# Patient Record
Sex: Female | Born: 1970 | Race: White | Hispanic: No | Marital: Married | Smoking: Never smoker
Health system: Southern US, Community
[De-identification: ages and names within clinical notes are randomized; demographics above are authoritative.]

## PROBLEM LIST (undated history)

## (undated) DIAGNOSIS — F32A Depression, unspecified: Secondary | ICD-10-CM

## (undated) DIAGNOSIS — G43909 Migraine, unspecified, not intractable, without status migrainosus: Secondary | ICD-10-CM

## (undated) DIAGNOSIS — F419 Anxiety disorder, unspecified: Secondary | ICD-10-CM

## (undated) DIAGNOSIS — R51 Headache: Secondary | ICD-10-CM

## (undated) DIAGNOSIS — F329 Major depressive disorder, single episode, unspecified: Secondary | ICD-10-CM

## (undated) HISTORY — DX: Anxiety disorder, unspecified: F41.9

## (undated) HISTORY — DX: Major depressive disorder, single episode, unspecified: F32.9

## (undated) HISTORY — PX: APPENDECTOMY: SHX54

## (undated) HISTORY — DX: Headache: R51

## (undated) HISTORY — DX: Depression, unspecified: F32.A

---

## 1998-03-27 ENCOUNTER — Other Ambulatory Visit: Admission: RE | Admit: 1998-03-27 | Discharge: 1998-03-27 | Payer: Self-pay | Admitting: *Deleted

## 1999-04-10 ENCOUNTER — Other Ambulatory Visit: Admission: RE | Admit: 1999-04-10 | Discharge: 1999-04-10 | Payer: Self-pay | Admitting: *Deleted

## 1999-05-09 ENCOUNTER — Ambulatory Visit (HOSPITAL_COMMUNITY): Admission: RE | Admit: 1999-05-09 | Discharge: 1999-05-09 | Payer: Self-pay | Admitting: Gastroenterology

## 2000-05-14 ENCOUNTER — Other Ambulatory Visit: Admission: RE | Admit: 2000-05-14 | Discharge: 2000-05-14 | Payer: Self-pay | Admitting: *Deleted

## 2001-03-02 ENCOUNTER — Encounter (INDEPENDENT_AMBULATORY_CARE_PROVIDER_SITE_OTHER): Payer: Self-pay | Admitting: Specialist

## 2001-03-02 ENCOUNTER — Ambulatory Visit (HOSPITAL_COMMUNITY): Admission: RE | Admit: 2001-03-02 | Discharge: 2001-03-02 | Payer: Self-pay | Admitting: Obstetrics & Gynecology

## 2001-10-09 ENCOUNTER — Other Ambulatory Visit: Admission: RE | Admit: 2001-10-09 | Discharge: 2001-10-09 | Payer: Self-pay | Admitting: Obstetrics and Gynecology

## 2002-11-10 ENCOUNTER — Other Ambulatory Visit: Admission: RE | Admit: 2002-11-10 | Discharge: 2002-11-10 | Payer: Self-pay | Admitting: Obstetrics and Gynecology

## 2003-11-02 ENCOUNTER — Encounter (INDEPENDENT_AMBULATORY_CARE_PROVIDER_SITE_OTHER): Payer: Self-pay | Admitting: *Deleted

## 2003-11-02 ENCOUNTER — Observation Stay (HOSPITAL_COMMUNITY): Admission: EM | Admit: 2003-11-02 | Discharge: 2003-11-03 | Payer: Self-pay | Admitting: Emergency Medicine

## 2005-09-28 ENCOUNTER — Emergency Department (HOSPITAL_COMMUNITY): Admission: EM | Admit: 2005-09-28 | Discharge: 2005-09-29 | Payer: Self-pay | Admitting: Emergency Medicine

## 2005-09-29 ENCOUNTER — Emergency Department (HOSPITAL_COMMUNITY): Admission: EM | Admit: 2005-09-29 | Discharge: 2005-09-29 | Payer: Self-pay | Admitting: Emergency Medicine

## 2006-02-20 ENCOUNTER — Ambulatory Visit: Payer: Self-pay | Admitting: Family Medicine

## 2006-03-06 ENCOUNTER — Ambulatory Visit: Payer: Self-pay | Admitting: Family Medicine

## 2006-03-20 ENCOUNTER — Ambulatory Visit: Payer: Self-pay | Admitting: Family Medicine

## 2006-11-19 DIAGNOSIS — G43909 Migraine, unspecified, not intractable, without status migrainosus: Secondary | ICD-10-CM | POA: Insufficient documentation

## 2007-03-26 ENCOUNTER — Ambulatory Visit: Payer: Self-pay | Admitting: Family Medicine

## 2007-03-26 DIAGNOSIS — F329 Major depressive disorder, single episode, unspecified: Secondary | ICD-10-CM

## 2007-03-26 DIAGNOSIS — F32A Depression, unspecified: Secondary | ICD-10-CM | POA: Insufficient documentation

## 2007-04-06 ENCOUNTER — Telehealth: Payer: Self-pay | Admitting: Family Medicine

## 2007-08-18 ENCOUNTER — Telehealth: Payer: Self-pay | Admitting: Family Medicine

## 2007-09-18 ENCOUNTER — Encounter: Admission: RE | Admit: 2007-09-18 | Discharge: 2007-09-18 | Payer: Self-pay | Admitting: Surgical Oncology

## 2007-10-26 ENCOUNTER — Emergency Department (HOSPITAL_COMMUNITY): Admission: EM | Admit: 2007-10-26 | Discharge: 2007-10-26 | Payer: Self-pay | Admitting: Emergency Medicine

## 2008-01-11 ENCOUNTER — Ambulatory Visit: Payer: Self-pay | Admitting: Cardiology

## 2008-01-11 ENCOUNTER — Ambulatory Visit: Payer: Self-pay | Admitting: Internal Medicine

## 2008-01-11 ENCOUNTER — Observation Stay (HOSPITAL_COMMUNITY): Admission: EM | Admit: 2008-01-11 | Discharge: 2008-01-12 | Payer: Self-pay | Admitting: Emergency Medicine

## 2008-01-11 DIAGNOSIS — R071 Chest pain on breathing: Secondary | ICD-10-CM | POA: Insufficient documentation

## 2008-01-12 ENCOUNTER — Encounter: Payer: Self-pay | Admitting: Internal Medicine

## 2008-01-12 ENCOUNTER — Ambulatory Visit: Payer: Self-pay | Admitting: Vascular Surgery

## 2008-01-13 ENCOUNTER — Emergency Department (HOSPITAL_COMMUNITY): Admission: EM | Admit: 2008-01-13 | Discharge: 2008-01-13 | Payer: Self-pay | Admitting: Emergency Medicine

## 2008-01-19 ENCOUNTER — Ambulatory Visit: Payer: Self-pay | Admitting: Family Medicine

## 2008-08-30 ENCOUNTER — Telehealth: Payer: Self-pay | Admitting: Internal Medicine

## 2008-09-16 ENCOUNTER — Telehealth: Payer: Self-pay | Admitting: Family Medicine

## 2008-10-03 ENCOUNTER — Telehealth: Payer: Self-pay | Admitting: Family Medicine

## 2008-10-06 ENCOUNTER — Telehealth: Payer: Self-pay | Admitting: *Deleted

## 2008-10-07 ENCOUNTER — Inpatient Hospital Stay (HOSPITAL_COMMUNITY): Admission: EM | Admit: 2008-10-07 | Discharge: 2008-10-09 | Payer: Self-pay | Admitting: Emergency Medicine

## 2008-10-07 ENCOUNTER — Ambulatory Visit: Payer: Self-pay | Admitting: Internal Medicine

## 2008-10-08 ENCOUNTER — Ambulatory Visit: Payer: Self-pay | Admitting: Internal Medicine

## 2008-10-10 ENCOUNTER — Ambulatory Visit: Payer: Self-pay | Admitting: Family Medicine

## 2008-10-10 DIAGNOSIS — N390 Urinary tract infection, site not specified: Secondary | ICD-10-CM | POA: Insufficient documentation

## 2008-10-13 ENCOUNTER — Telehealth: Payer: Self-pay | Admitting: Family Medicine

## 2009-05-03 ENCOUNTER — Ambulatory Visit: Payer: Self-pay | Admitting: Family Medicine

## 2009-05-18 ENCOUNTER — Telehealth: Payer: Self-pay | Admitting: Family Medicine

## 2009-07-28 ENCOUNTER — Telehealth: Payer: Self-pay | Admitting: Family Medicine

## 2010-03-18 ENCOUNTER — Encounter: Payer: Self-pay | Admitting: Orthopedic Surgery

## 2010-03-19 ENCOUNTER — Encounter: Payer: Self-pay | Admitting: Surgical Oncology

## 2010-03-29 NOTE — Progress Notes (Signed)
Summary: REFILL  Phone Note Refill Request Message from:  Fax from Pharmacy  Refills Requested: Medication #1:  RELPAX 40 MG  TABS as needed [facility] FA[facility]----[phone]  Initial call taken by: Warnell Forester,  May 18, 2009 8:56 AM    Prescriptions: RELPAX 40 MG  TABS (ELETRIPTAN HYDROBROMIDE) as needed  #90 x 3   Entered by:   Kern Reap CMA (AAMA)   Authorized by:   Roderick Pee MD   Signed by:   Kern Reap CMA (AAMA) on 05/18/2009   Method used:   Electronically to        [facility]* (mail-order)             ,          Ph: [phone]       Fax: [phone]   RxID:   [id number]

## 2010-03-29 NOTE — Assessment & Plan Note (Signed)
Summary: MED CHECK/REFILLS/CJR   Vital Signs:  Patient profile:   40 year old female Height:      64 inches Weight:      180 pounds BMI:     31.01 Temp:     98.3 degrees F oral BP sitting:   112 / 78  (left arm) Cuff size:   regular  Vitals Entered By: Kern Reap CMA Duncan Dull) (May 03, 2009 10:43 AM)  Reason for Visit follow up meds  History of Present Illness: Charlene Freeman is a 40 year old female, nonsmoker, who comes in today for evaluation of two problems.  She is a history of migraine headaches.  She's currently having about 6 per month.  They always seem to occur about the same time.  In the past.  We tried a beta-blocker, which did not help.  We also tried Topamax, however, she didn't take more than 50 mg a day.  We discussed options.  She elects to go back on the Topamax.  She also takes Motrin and Vicodin for breakthrough migraines.  She went to see Dr. Harrel Carina..... a psychiatrist..... for evaluation of depression because the 40 mg of Celexa did not seem to be helping.  She is currently on Lexapro 30 mg daily however, her insurance will pay for it except for $50 co-pay for 30 tablets.  She would like to discuss options.  I also discussed going back to the headache clinic.  She states that it didn't help  Allergies (verified): No Known Drug Allergies  Past History:  Past medical, surgical, family and social histories (including risk factors) reviewed for relevance to current acute and chronic problems.  Past Medical History: Reviewed history from 03/26/2007 and no changes required. Headache Depression  Past Surgical History: Reviewed history from 11/19/2006 and no changes required. CB x2 Appendectomy  Family History: Reviewed history from 11/19/2006 and no changes required. Family Hsitory Headaches Family History of Glaucoma  Social History: Reviewed history from 11/19/2006 and no changes required. Occupation: Single Never Smoked  Review of Systems      See  HPI  Physical Exam  General:  Well-developed,well-nourished,in no acute distress; alert,appropriate and cooperative throughout examination   Impression & Recommendations:  Problem # 1:  HEADACHE (ICD-784.0) Assessment Deteriorated  The following medications were removed from the medication list:    Corgard 20 Mg Tabs (Nadolol) .Marland Kitchen... 1 tab @ bedtime Her updated medication list for this problem includes:    Relpax 40 Mg Tabs (Eletriptan hydrobromide) .Marland Kitchen... As needed    Hydrocodone-acetaminophen 5-325 Mg Tabs (Hydrocodone-acetaminophen) .Marland Kitchen... As needed ha    [facility] Ibuprofen 200 Mg Caps (Ibuprofen) .Marland Kitchen... As needed ha  Orders: Prescription Created Electronically [phone])  Complete Medication List: 1)  Alprazolam 0.25 Mg Tabs (Alprazolam) .... One up to three times a day as needed 2)  Relpax 40 Mg Tabs (Eletriptan hydrobromide) .... As needed 3)  Hydrocodone-acetaminophen 5-325 Mg Tabs (Hydrocodone-acetaminophen) .... As needed ha 4)  Promethazine Hcl 25 Mg Tabs (Promethazine hcl) .... As needed for ha 5)  Bcp  6)  [facility] Ibuprofen 200 Mg Caps (Ibuprofen) .... As needed ha 7)  Topamax 100 Mg Tabs (Topiramate) .Marland Kitchen.. 1 tab @ bedtime 8)  Celexa 40 Mg Tabs (Citalopram hydrobromide) .Marland Kitchen.. 1 & 1/2 at bedtime  Patient Instructions: 1)  begin Topamax 50 mg at bedtime, in one week increase it to 100 mg at bedtime.  Return in 4 weeks for follow-up. 2)  Take Vicodin and 600 mg of Motrin.  Stat at the onset  of a headache that the Relpax will not resolve. 3)  Stop the Lexapro start Celexa 60 mg daily Prescriptions: HYDROCODONE-ACETAMINOPHEN 5-325 MG  TABS (HYDROCODONE-ACETAMINOPHEN) as needed ha  #50 x 2   Entered and Authorized by:   Roderick Pee MD   Signed by:   Roderick Pee MD on 05/03/2009   Method used:   Print then Give to Patient   RxID:   [id number] PROMETHAZINE HCL 25 MG  TABS (PROMETHAZINE HCL) as needed for ha  #30 x 2   Entered and Authorized by:   Roderick Pee MD   Signed  by:   Roderick Pee MD on 05/03/2009   Method used:   Electronically to        [facility] [phone]* (retail)       [address]       [place], [place]  [address]       Ph: [phone] or [phone]       Fax: [phone]   RxID:   [id number] RELPAX 40 MG  TABS (ELETRIPTAN HYDROBROMIDE) as needed  #20 x 3   Entered and Authorized by:   Roderick Pee MD   Signed by:   Roderick Pee MD on 05/03/2009   Method used:   Electronically to        [facility] [phone]* (retail)       [address]       [place], [place]  [address]       Ph: [phone] or [phone]       Fax: [phone]   RxID:   [id number] CELEXA 40 MG TABS (CITALOPRAM HYDROBROMIDE) 1 & 1/2 at bedtime  #150 x 3   Entered and Authorized by:   Roderick Pee MD   Signed by:   Roderick Pee MD on 05/03/2009   Method used:   Electronically to        [facility] [phone]* (retail)       [address]       [place], [place]  [address]       Ph: [phone] or [phone]       Fax: [phone]   RxID:   [id number] TOPAMAX 100 MG TABS (TOPIRAMATE) 1 tab @ bedtime  #100 x 3   Entered and Authorized by:   Roderick Pee MD   Signed by:   Roderick Pee MD on 05/03/2009   Method used:   Electronically to        [facility] [phone]* (retail)       [address]       [place], [place]  [address]       Ph: [phone] or [phone]       Fax: [phone]   RxID:   [id number]

## 2010-03-29 NOTE — Progress Notes (Signed)
Summary: refill  Phone Note Refill Request Message from:  Fax from Pharmacy on July 28, 2009 1:02 PM  Refills Requested: Medication #1:  CELEXA 40 MG TABS 1 & 1/2 at bedtime. Initial call taken by: Kern Reap CMA (AAMA),  July 28, 2009 1:02 PM    Prescriptions: CELEXA 40 MG TABS (CITALOPRAM HYDROBROMIDE) 1 & 1/2 at bedtime  #150 x 3   Entered by:   Kern Reap CMA (AAMA)   Authorized by:   Roderick Pee MD   Signed by:   Kern Reap CMA (AAMA) on 07/28/2009   Method used:   Faxed to ...       [facility]* (mail-order)             ,          Ph: [phone]       Fax: [phone]   RxID:   [id number]

## 2010-04-22 ENCOUNTER — Encounter: Payer: Self-pay | Admitting: Family Medicine

## 2010-04-23 ENCOUNTER — Encounter: Payer: Self-pay | Admitting: Family Medicine

## 2010-04-23 ENCOUNTER — Ambulatory Visit (INDEPENDENT_AMBULATORY_CARE_PROVIDER_SITE_OTHER): Payer: BC Managed Care – PPO | Admitting: Family Medicine

## 2010-04-23 DIAGNOSIS — N938 Other specified abnormal uterine and vaginal bleeding: Secondary | ICD-10-CM

## 2010-04-23 DIAGNOSIS — N949 Unspecified condition associated with female genital organs and menstrual cycle: Secondary | ICD-10-CM

## 2010-04-23 DIAGNOSIS — R51 Headache: Secondary | ICD-10-CM

## 2010-04-23 MED ORDER — HYDROCODONE-ACETAMINOPHEN 5-325 MG PO TABS: 1.0000 | ORAL_TABLET | ORAL | Status: DC | PRN

## 2010-04-23 MED ORDER — TOPIRAMATE 100 MG PO TABS: ORAL_TABLET | ORAL | Status: DC

## 2010-04-23 MED ORDER — TOPIRAMATE 200 MG PO TABS: 200.0000 mg | ORAL_TABLET | ORAL | Status: DC

## 2010-04-23 MED ORDER — ONDANSETRON HCL 4 MG PO TABS: 4.0000 mg | ORAL_TABLET | Freq: Every day | ORAL | Status: AC | PRN

## 2010-04-23 MED ORDER — LEVONORGEST-ETH ESTRAD 91-DAY 0.15-0.03 MG PO TABS: 1.0000 | ORAL_TABLET | Freq: Every day | ORAL | Status: AC

## 2010-04-23 NOTE — Patient Instructions (Signed)
Increase the Topamax to 200 mg nightly at bedtime.  Zofran and Vicodin p.r.n. For breakthrough migraines.  We start the BCPs one daily at bedtime for 90 days.  Return in 4 weeks for follow-up

## 2010-04-23 NOTE — Progress Notes (Signed)
  Subjective:    Patient ID: Charlene Freeman, female    DOB: 01-Dec-1970, 40 y.o.   MRN: [id number]  HPIPatricia is a 40 year old single female, nonsmoker, who comes in today for evaluation of two problems.  She has a history of migraine headaches.  She is currently taking 150 mg of Topamax daily, however, she does have breakthrough migraines.  She will occasionally have to take more Relpax that her insurance will cover.  She then will take a Vicodin and a Phenergan.  She would like to switch to Zofran.  We discussed increasing her Topamax.  For birth control.  Her partner is using condoms.  We discussed not getting pregnant taking Topamax.  She elects to restart her BCPs Pap by GYN in December 2011 normal  She continues to see the psychiatrist, Dr. Lafayette Freeman for treatment of depression.  She is currently on 40 mg of Lexapro nightly and alprazolam .25t.i.d. P.r.n.Follow-up every 6 months by the psychiatrist  Review of Systems    Negative Objective:   Physical Exam    Well-developed well-nourished, female, no acute distress    Assessment & Plan:  Migraine headaches, not under good control,,,,,,, plan increase the Topamax to 200 mg daily follow-up in one month.  Also change the Phenergan to Zofran p.r.n.  Dub,,,,,,,,,,, restart BCPs

## 2010-04-26 ENCOUNTER — Other Ambulatory Visit: Payer: Self-pay | Admitting: *Deleted

## 2010-04-26 MED ORDER — ELETRIPTAN HYDROBROMIDE 40 MG PO TABS: 40.0000 mg | ORAL_TABLET | ORAL | Status: DC | PRN

## 2010-05-21 ENCOUNTER — Ambulatory Visit: Payer: BC Managed Care – PPO | Admitting: Family Medicine

## 2010-05-21 DIAGNOSIS — Z0289 Encounter for other administrative examinations: Secondary | ICD-10-CM

## 2010-06-02 LAB — CBC
HCT: 31.8 % — ABNORMAL LOW (ref 36.0–46.0)
HCT: 33.7 % — ABNORMAL LOW (ref 36.0–46.0)
Hemoglobin: 10.9 g/dL — ABNORMAL LOW (ref 12.0–15.0)
Hemoglobin: 11.6 g/dL — ABNORMAL LOW (ref 12.0–15.0)
MCHC: 34.3 g/dL (ref 30.0–36.0)
MCHC: 34.3 g/dL (ref 30.0–36.0)
MCV: 90.5 fL (ref 78.0–100.0)
MCV: 90.9 fL (ref 78.0–100.0)
Platelets: 141 10*3/uL — ABNORMAL LOW (ref 150–400)
RBC: 3.49 MIL/uL — ABNORMAL LOW (ref 3.87–5.11)
RBC: 3.73 MIL/uL — ABNORMAL LOW (ref 3.87–5.11)
RDW: 12.4 % (ref 11.5–15.5)
WBC: 9.2 10*3/uL (ref 4.0–10.5)
WBC: 9.5 10*3/uL (ref 4.0–10.5)

## 2010-06-02 LAB — URINALYSIS, ROUTINE W REFLEX MICROSCOPIC
Bilirubin Urine: NEGATIVE
Bilirubin Urine: NEGATIVE
Glucose, UA: NEGATIVE mg/dL
Nitrite: NEGATIVE
Protein, ur: 30 mg/dL — AB
Specific Gravity, Urine: 1.02 (ref 1.005–1.030)
Urobilinogen, UA: 2 mg/dL — ABNORMAL HIGH (ref 0.0–1.0)
pH: 6.5 (ref 5.0–8.0)

## 2010-06-02 LAB — PROTIME-INR: Prothrombin Time: 13.2 seconds (ref 11.6–15.2)

## 2010-06-02 LAB — HEPATIC FUNCTION PANEL
ALT: 15 U/L (ref 0–35)
AST: 19 U/L (ref 0–37)
Alkaline Phosphatase: 55 U/L (ref 39–117)
Bilirubin, Direct: 0.1 mg/dL (ref 0.0–0.3)
Indirect Bilirubin: 0.5 mg/dL (ref 0.3–0.9)
Total Protein: 7 g/dL (ref 6.0–8.3)

## 2010-06-02 LAB — BASIC METABOLIC PANEL
CO2: 23 mEq/L (ref 19–32)
CO2: 26 mEq/L (ref 19–32)
Chloride: 100 mEq/L (ref 96–112)
Chloride: 107 mEq/L (ref 96–112)
GFR calc Af Amer: >60 mL/min (ref >60)
GFR calc Af Amer: >60 mL/min (ref >60)
Potassium: 3.1 mEq/L — ABNORMAL LOW (ref 3.5–5.1)
Potassium: 4.4 mEq/L (ref 3.5–5.1)
Sodium: 136 mEq/L (ref 135–145)

## 2010-06-02 LAB — DIFFERENTIAL
Basophils Absolute: 0 10*3/uL (ref 0.0–0.1)
Basophils Relative: 0 % (ref 0–1)
Lymphocytes Relative: 6 % — ABNORMAL LOW (ref 12–46)
Neutro Abs: 7.8 10*3/uL — ABNORMAL HIGH (ref 1.7–7.7)
Neutrophils Relative %: 85 % — ABNORMAL HIGH (ref 43–77)

## 2010-06-02 LAB — B. BURGDORFI ANTIBODIES BY WB: B burgdorferi IgM Abs (IB): NEGATIVE

## 2010-06-02 LAB — CSF CULTURE W GRAM STAIN: Gram Stain: NONE SEEN

## 2010-06-02 LAB — CSF CELL COUNT WITH DIFFERENTIAL
RBC Count, CSF: 2 /mm3 — ABNORMAL HIGH
Tube #: 4

## 2010-06-02 LAB — URINE MICROSCOPIC-ADD ON

## 2010-06-02 LAB — CULTURE, BLOOD (ROUTINE X 2): Culture: NO GROWTH

## 2010-06-02 LAB — URINE CULTURE
Colony Count: >=100000
Culture: NO GROWTH

## 2010-06-02 LAB — PREGNANCY, URINE: Preg Test, Ur: NEGATIVE

## 2010-06-02 LAB — APTT: aPTT: 29 seconds (ref 24–37)

## 2010-07-10 NOTE — Discharge Summary (Signed)
NAME:  Charlene Freeman, Charlene Freeman             ACCOUNT NO.:  [id number]   MEDICAL RECORD NO.:  [id number]          PATIENT TYPE:  INP   LOCATION:  4715                         FACILITY:  [facility]   PHYSICIAN:  Valerie A. Felicity Coyer, MDDATE OF BIRTH:  [birth year]   DATE OF ADMISSION:  01/11/2008  DATE OF DISCHARGE:  01/12/2008                               DISCHARGE SUMMARY   DIAGNOSES AT TIME OF DISCHARGE:  1. Atypical chest pain, suspect costochondritis.  2. History of migraine headaches.  Positive headache during this      admission.  3. Anxiety.  4. Dyslipidemia.  5. Urinary tract infection, currently on Cipro.  Urine culture pending      at the time of this dictation.   HISTORY OF PRESENT ILLNESS:  Charlene Freeman is a 40 year old female admitted  on January 11, 2008, with chief complaint of chest pain.  She also has  a history of migraines and anxiety.  Apparently at the work the day  prior to admission was feeling at baseline, when all of a sudden she  developed acute shortness of breath and chest pain.  She noted that the  pain radiated to her right arm and also underneath her left breast.  She  was admitted for further evaluation and treatment.   PAST MEDICAL HISTORY:  1. Anxiety.  2. Migraine.   COURSE OF HOSPITALIZATION:  1. Atypical chest pain.  The patient was admitted.  She underwent      serial cardiac enzymes which were negative x3.  EKG noted sinus      tachycardia but was otherwise normal.  A 2-D echo performed during      this admission noted normal LV function with no wall motion      abnormalities.  D-dimer was noted to be mildly elevated which      prompted CT angio.  This unfortunately was suboptimal study,      although it showed no central filling defects.  This was followed      by a V/Q which was negative for PE as well as bilateral lower      extremity Dopplers which were negative for DVT.  Serial cardiac      enzymes were negative.  She was noted to have an elevated    cholesterol with total cholesterol of 224 and an LDL of 156.  She      has been counseled on a low-cholesterol diet but will need follow      up with her primary care Charlene Freeman to determine need for statin      therapy.  Her oxygen saturation was 100% on room air.  We will      initiate NSAIDs at this time and continue the patient on Protonix      at time of discharge.  Defer outpatient need for stress test to the      patient's primary MD.  She denies any first-degree family history      of early coronary artery disease.  2. Anxiety.  Plan to continue p.r.n. benzo and Effexor.  3. Urinary tract infection, currently on Cipro.  Urine culture  pending      at the time of this dictation.  Also of note, the patient was noted      to have some mild elevation of her blood pressure.  BP at the time      of discharge is 145/80.  She may need addition of antihypertensive      medication if her BP remains elevated.  Defer to the patient's      primary MD.  4. History of migraines.  The patient does complain of some nausea as      well as severe headache earlier this morning at the top of the C-      spine near the base of the skull.  It is currently improved but      still present.  At this time, we will give her a dose of Relpax      which she takes at home during acute migraine flares and continue      p.r.n. antiemetics.  The patient tolerated dinner, anticipate      discharge to home later this evening.   MEDICATIONS AT TIME OF DISCHARGE:  1. Celexa 40 mg p.o. daily.  2. Nadolol 20 mg p.o. daily.  3. Xanax 0.5 mg p.o. q.8 h.  4. Motrin 600 mg p.o. q.6 h for the next several days.  5. Cipro 500 mg p.o. b.i.d. x3 days.  6. Percocet 5/325 one tablet p.o. q.4 h as needed.  7. Phenergan 12.5 mg p.o. q.6 h as needed.  8. Protonix 40 mg p.o. daily for reflux.   PERTINENT LABORATORIES:  At time of discharge, D-dimer 0.51, creatinine  0.71, hemoglobin 13.2, total cholesterol 224, HDL 55, and LDL  156.  Serial cardiac enzymes negative.   FOLLOWUP:  She is to follow up with Dr. Kelle Darting on Tuesday,  January 19, 2008, at 11:15 a.m.  She will need follow up of her blood  pressure as well as final urine culture results at this appointment.   Greater than 30 minutes was spent on discharge planning.      Sandford Craze, NP      Raenette Rover. Felicity Coyer, MD  Electronically Signed    MO/MEDQ  D:  01/12/2008  T:  01/13/2008  Job:  [id number]   cc:   Tinnie Gens A. Tawanna Cooler, MD

## 2010-07-10 NOTE — H&P (Signed)
[name], [name]             ACCOUNT NO.:  [id number]   MEDICAL RECORD NO.:  [id number]          PATIENT TYPE:  INP   LOCATION:  4729                         FACILITY:  [facility]   PHYSICIAN:  Michiel Cowboy, MDDATE OF BIRTH:  [birth year]   DATE OF ADMISSION:  01/11/2008  DATE OF DISCHARGE:                              HISTORY & PHYSICAL   PRIMARY CARE Ines Warf:  Dr. Tawanna Cooler.   CHIEF COMPLAINT:  Chest pain.   [name] is a 40 year old female with history of migraines and anxiety  who was at work yesterday, was feeling at baseline although all of the  sudden she developed shortness of breath and chest pain which was well  worse sometimes with lying flat and radiating to her right arm and also  underneath her breast on the left as well as subscapular space on the  left as well.  The pain was severe enough to bring her to tears,  fluctuating in severity but otherwise is constant.  She also endorses  worsening pain with inspiration.  She has not had any long car rides or  plane rides.  She did have a history of taking progesterone recently and  was in the past on Depo-Medrol which she also had stopped because she  was gaining some weight.   REVIEW OF SYSTEMS:  No nausea, no vomiting.  No constipation.  No  diarrhea.  No black stools.  No blood per rectum.  Does not endorse any  presyncope or lightheadedness.  The patient thought that she did have an  anxiety attack and took some Xanax but that did not help.  Otherwise  review of systems unremarkable.   PAST MEDICAL HISTORY:  Significant for anxiety and migraine.   SOCIAL HISTORY:  The patient occasionally smokes and drinks but does not  use IV drugs.   FAMILY HISTORY:  Noncontributory.   ALLERGIES:  No known drug allergies.   MEDICATIONS:  1. Celexa 40 mg daily.  2. Nadolol 20 mg daily.  3. Xanax 0.5 as needed.  4. She takes Relpax if she really needs it.   VITAL SIGNS:  Temperature 98.2, blood pressure 146/90,  respirations 22,  pulse 125, saturating 100% on room air.  The patient appears to be in no  acute distress.  Currently, her pain is under control.  HEAD:  Nontraumatic.  Moist mucous membranes.  LUNGS:  Clear to auscultation bilaterally.  HEART:  Rapid but regular.  No murmurs, rubs or gallops.  LOWER EXTREMITIES:  Without clubbing, cyanosis or edema.  ABDOMEN:  Soft, nontender, nondistended.  NEUROLOGIC:  Intact.   LABORATORY DATA:  White blood cell count 10.5, hemoglobin 13.2,  platelets 231.  D-dimer 0.51.  Sodium 137, potassium 3.3, creatinine  0.71.  Cardiac enzymes within normal limits.  Pregnancy test negative.  UA showing 7-10 white blood cells.  EKG showing sinus tachycardia,  otherwise unremarkable.  CT scan of the chest showing no acute  cardiopulmonary process, suboptimal study main, but there is no central  focal feeling defect seen but unable to evaluate periphery well.   Bedside echogram was performed that showed normal unremarkable  EF with  no evidence of pericardial effusion.   ASSESSMENT AND PLAN:  This is a 40 year old female with severe chest  pain, slightly elevated D-dimer, and CT scan of could not rule out  peripheral pulmonary embolism with tachycardia and shortness of breath.   Chest pain.  Differential would include a small pulmonary embolism  versus acute coronary syndrome which is less likely given her age versus  musculoskeletal versus pericarditis very unlikely given there are no  rubs, no EKG changes, and bedside echocardiogram did not show any  significant evidence of pericardial effusion.   We will cycle cardiac enzymes q.8 h.  In the a.m., will do VQ scan.  For  right now, we will continue Lovenox b.i.d.  Will also obtain Dopplers of  lower extremities.  Will give IV fluids, follow orthostatics, control  pain with Dilaudid and Percocet as needed.  We will obtain fasting lipid  panel, hemoglobin A1c, check TSH.  Will obtain 2-D echocardiogram.    History of anxiety and depression.  Continue home medications.   Prophylaxis.  Will do Protonix.  The patient started on Lovenox.   History of migraines.  Will continue nadolol holding parameters.   Dr. Rene Paci will resume care in a.m.      Michiel Cowboy, MD  Electronically Signed     AVD/MEDQ  D:  01/11/2008  T:  01/12/2008  Job:  [id number]   cc:   Tawanna Cooler, M.D.

## 2010-07-10 NOTE — Discharge Summary (Signed)
NAMEJUNO, Charlene Freeman             ACCOUNT NO.:  [id number]   MEDICAL RECORD NO.:  [id number]          PATIENT TYPE:  INP   LOCATION:  1520                         [facility]ILITY[facility]   PHYSICIAN:  Titus Dubin. Hopper, MD,FACP,FCCPDATE OF BIRTH:  February 19, 1971   DATE OF ADMISSION:  10/07/2008  DATE OF DISCHARGE:  10/09/2008                               DISCHARGE SUMMARY   ADMITTING DIAGNOSES:  1. Fever, rule out bacterial meningitis, possible viral infection,      rule out urinary tract infection.  2. Headache.  3. Anxiety.   DISCHARGE DIAGNOSES:  1. Fever,bacterial meningitis R/Oed  2. Headache.  3. Anxiety.  4. E coli UTI   For details of history, please see the note by Dr. Drue Novel, October 07, 2008.  The patient presented with a chief complaint of fever in the context of  a headache present for 4 to 5 days.  On the morning of October 04, 2008,  she noted a new headache different from her typical migraines in the  occipital area.  Symptoms worsened over the next several days.  Temperature was noted be as high as 101.4, associated with chills.  The  patient described stiffness from the neck down her spine.   CAT scan without contrast was negative.  This was done as a prelude to  LP.  Chest x-ray revealed no active cardiopulmonary disease.   CBC and differential revealed a normal white count; hematocrit was 33.7.  The initial urinalysis showed positive nitrites, moderate leukocytes,  and trace blood.  The patient was having her menses.  It also revealed  21 to 50 white cells and many bacteria.   Her initial potassium was 3.1 and sodium 134.   CSF glucose was 54, total protein 36.  Cell count on the CSF revealed 0  white cells and 2 red cells.   Hepatic function was normal.  Blood cultures were negative at the time  of discharge.   Cath urine on the day after admission was negative,but the urine culture  did show over 100,000 E. coli.   The patient was initially placed on  respiratory isolation.  She received  Rocephin 1 g parenterally daily.   SHE HAS NO KNOWN DRUG ALLERGIES.  Because of the E. coli urinary tract  infection, she will be discharged on Macrodantin.  Initially was planned  to send her home on doxycycline but tick-borne disease is unlikely based  on the history and physical.  [facility] spotted fever titer and  Lyme titers will be collected on the day of discharge. The UTI accounts  for her fever.   On the day of discharge, she was afebrile.  Blood pressure was 101/69.  Respiratory rate was 18.  O2 saturations were 99% on room air.  She  complained of some nausea but her headache had resolved.  Chest was  clear and she had no significant murmurs.  There was minimal tenderness  in the left midabdomen, but as stated she is having her menses.  The  abdomen was soft and no mass was palpable   The platelet count was mildly reduced at 135,000.  Her initial  hypokalemia had been corrected and was 4.4 on the day of discharge.   FINAL DIAGNOSES:  1. Fever.  2. Headache.  3. Urinary tract infection, E. coli   Because of the nausea, she will continue with the Phenergan she has @  home.  She has been asked to follow a clear liquid diet until the nausea  resolves.  Specifically, she was asked to avoid milk, dairy, and grease  and to ingest Jell-O, sherbet, Lipton chicken noodle soup, flat Coke or  flat ginger ale, dry toast, crackers, and baked foods until the nausea  resolves.   DISCHARGE STATUS:  Improved.   PROGNOSIS:  Appears good.   There would be no change in her home meds as listed on the medicine  reconciliation sheet.      Titus Dubin. Alwyn Ren, MD,FACP,FCCP  Electronically Signed     [facility]/MEDQ  D:  10/09/2008  T:  10/09/2008  Job:  [id number]   cc:   Tinnie Gens A. Tawanna Cooler, MD  [address] [place]  [place] [address]   Milagros Evener, M.D.  Fax: [phone]

## 2010-07-13 NOTE — Op Note (Signed)
NAME:  [name], [name]                       ACCOUNT NO.:  [id number]   MEDICAL RECORD NO.:  [id number]                   PATIENT TYPE:  INP   LOCATION:  0447                                 FACILITY:  [facility]   PHYSICIAN:  Thornton Park. Daphine Deutscher, M.D.             DATE OF BIRTH:  07-05-1970   DATE OF PROCEDURE:  11/02/2003  DATE OF DISCHARGE:                                 OPERATIVE REPORT   PREOPERATIVE DIAGNOSIS:  Acute appendicitis.   POSTOPERATIVE DIAGNOSIS:  Early acute appendicitis.   SURGEON:  Thornton Park. Daphine Deutscher, M.D.   ANESTHESIA:  General.   PROCEDURE:  Laparoscopic appendectomy.   DESCRIPTION OF PROCEDURE:  Charlene Freeman is a 40 year old lady having her  birthday on the day of surgery, who presented to the ED with about a 12 hour  history of abdominal pain in the right lower quadrant associated with nausea  and vomiting.  She has had similar pains at different times in the past, one  of which led her to the gynecologist for complete gynecologic work-up which  was negative.  These pains would abate.  However, the pain today was very  similar, came on, and stayed.   She had a CT scan and an ultrasound which showed the appendix to be upper  limits of normal in thickness, although there was no free fluid around it.  We discussed management, and I examined her and despite her being medicated  with Dilaudid, she still had point tenderness at McBurney's to palpation.  For that reason, we decided after discussions of various options to go ahead  and remove her appendix laparoscopically.  She is aware that we might take  out a negative appendix and aware of the complications of the procedure.   She was taken to room 1 in the early morning hours of September 7 and  general anesthesia.  She received 1 g of Mefoxin preop.  Longitudinal  incision was made down into the umbilicus, and there I found a tiny little  umbilical hernia which I enlarged to accommodate the Hasson trocar.   The  abdomen was insufflated, and a 5 mm was placed in the right upper quadrant,  and I explored the abdomen.  I took pictures which were on the chart which  showed fairly sizable right and left ovaries but for her age, within normal  limits, normal gallbladder and this turgid appendix, particularly at its  tip.   The procedure then progressed to put a 10-11 in the left lower quadrant  through which the scope went.  I operated through the other port sites and  grasped the appendix and elevated it.  I used the harmonic scalpel to  dissect the mesentery and divide the mesentery, isolating the base.  I then  stapled across the base with a vascular stapler of EndoGIA type, and I  placed the appendix in a bag and brought out through the umbilicus.  I then  went back in and irrigated with saline and saw no bleeding.  Everything  appeared to be in order.  I ran the distal bowel with Glassman pick-ups, and  I ran the terminal ileum for about the distal third, and this all appeared  to be normal.  The umbilical  port was repaired with 2-0 Vicryls under direct vision, and then the ports  were withdrawn, and the abdomen was deflated.  The patient seemed to  tolerate the procedure well, and she was taken to the recovery room in  satisfactory condition.                                               Thornton Park Daphine Deutscher, M.D.    MBM/MEDQ  D:  11/02/2003  T:  11/02/2003  Job:  [id number]

## 2010-07-13 NOTE — Op Note (Signed)
[facility]  Patient:    Charlene Freeman, Charlene Freeman Visit Number: [id number] MRN: [id number]          Service Type: DSU Location: [facility] Attending Physician:  Genia Del Dictated by:   Genia Del, M.D. Proc. Date: 03/02/01 Admit Date:  03/02/2001                             Operative Report  PREOPERATIVE DIAGNOSIS:       Incomplete abortion, post _____ , first trimester.  POSTOPERATIVE DIAGNOSIS:      Incomplete abortion, post _____ , first trimester.  OPERATION:                    Intervention dilatation and curettage.  SURGEON:                      Genia Del, M.D.  ANESTHESIOLOGIST:          Ellison Hughs., M.D.  DESCRIPTION OF PROCEDURE:     Under MAC analgesia, the patient is in the lithotomy position.  She was prepped with Betadine at the suprapubic, vulva and vaginal areas and draped as usual.  The vaginal exam reveals a retroverted uterus corresponding to seven to eight weeks gestation.  Cervix is closed.  No adnexal mass.  The speculum was introduced.  The anterior lip of the cervix was grasped with a tenaculum.  The paracervical block is done with lidocaine 1% plain, 10 cc at the 4 oclock and 10 cc at 8 oclock.  We then proceed with dilatation with Hegar dilators up to #29 easily.  A #8 curved suction curet is then used and brings back products of conception in small amounts.  This is sent to pathology.  We then used a sharp curet to systemically curettage all uterine surfaces.  The uterine sound is heard all over.  We the used the suction curet once more.  No products of conception were brought back at that point.  The instruments are removed.  The estimated blood loss was minimal. The patient was brought to the recovery room in good status.  She is RH negative and received the RhoGAM vaccine at [facility] office. Dictated by:   Genia Del, M.D. Attending Physician:  Genia Del DD:   03/02/01 TD:  03/02/01 Job: [id number]

## 2010-07-13 NOTE — Op Note (Signed)
[facility]  Patient:    Charlene Freeman, Charlene Freeman                    MRN: [id number] Proc. Date: 05/09/99 Adm. Date:  [id number] Attending:  Charna Elizabeth CC:         Sung Amabile. Roslyn Smiling, M.D.                           Operative Report  PROCEDURE:  Flexible sigmoidoscopy.  ENDOSCOPIST:  Anselmo Rod, M.D.  INSTRUMENT USED:  Olympus video colonoscope.  INDICATIONS:  This is a 40 year old white female with rectal bleeding and severe constipation, rule out internal hemorrhoids, colitis, etc.  PREPROCEDURE PREPARATION:  Informed consent was procured from the patient and the patient was fasted for eight hours prior to the procedure and prepped with two Fleets enemas the morning of the procedure.  PREPROCEDURE PHYSICAL EXAMINATION:  The patient had stable vital signs.  NECK: Supple.  CHEST: Clear to auscultation.  S1 and S2 regular.  ABDOMEN: Soft with normal abdominal bowel sounds.  DESCRIPTION OF PROCEDURE:  The patient was placed in the left lateral decubitus  position and sedated with 70 mg of Demerol and 6 mg of Versed intravenously. Once the patient was adequately sedated and maintained on low flow oxygen and continuous cardiac monitoring, the Olympus video colonoscope was advanced from the rectum o 80 cm without difficulty.  Except for internal hemorrhoids, no other abnormalities were seen.  The entire colonic mucosa appeared healthy with a normal vascular pattern.  IMPRESSION:  Normal flexible sigmoidoscopy up to 80 cm except for internal hemorrhoid.  RECOMMENDATIONS:  The patient has been advised to increase the fluid and fiber n her diet and to follow up in the office in the next two weeks. DD:  05/09/99 TD:  05/09/99 Job: [id number]

## 2010-11-27 LAB — DIFFERENTIAL
Basophils Absolute: 0
Basophils Relative: 0
Basophils Relative: 2 — ABNORMAL HIGH
Eosinophils Absolute: 0.1
Lymphocytes Relative: 35
Lymphs Abs: 2.9
Monocytes Absolute: 0.6
Monocytes Relative: 7
Neutro Abs: 4.5
Neutro Abs: 7.8 — ABNORMAL HIGH
Neutrophils Relative %: 75

## 2010-11-27 LAB — WET PREP, GENITAL
Trich, Wet Prep: NONE SEEN
Yeast Wet Prep HPF POC: NONE SEEN

## 2010-11-27 LAB — GC/CHLAMYDIA PROBE AMP, GENITAL
Chlamydia, DNA Probe: NEGATIVE
GC Probe Amp, Genital: NEGATIVE

## 2010-11-27 LAB — PROTEIN S ACTIVITY: Protein S Activity: 105 % (ref 69–129)

## 2010-11-27 LAB — URINALYSIS, ROUTINE W REFLEX MICROSCOPIC
Bilirubin Urine: NEGATIVE
Bilirubin Urine: NEGATIVE
Glucose, UA: NEGATIVE
Hgb urine dipstick: NEGATIVE
Ketones, ur: NEGATIVE
Ketones, ur: NEGATIVE
Nitrite: NEGATIVE
Protein, ur: NEGATIVE
Urobilinogen, UA: 1
Urobilinogen, UA: 1

## 2010-11-27 LAB — COMPREHENSIVE METABOLIC PANEL
BUN: 7
CO2: 23
Calcium: 9.5
Chloride: 104
Creatinine, Ser: 0.71
GFR calc non Af Amer: >60
Total Bilirubin: 0.6

## 2010-11-27 LAB — LUPUS ANTICOAGULANT PANEL
PTTLA 4:1 Mix: 50.2 — ABNORMAL HIGH (ref 36.3–48.8)
PTTLA Confirmation: 0 (ref <8.0)

## 2010-11-27 LAB — CBC
HCT: 38.8
Hemoglobin: 12.3
Hemoglobin: 12.4
MCHC: 33.6
MCHC: 34.1
MCV: 91.9
Platelets: 183
Platelets: 231
RBC: 3.96
RBC: 4.22
RDW: 12.4
WBC: 10.5
WBC: 6.5

## 2010-11-27 LAB — LIPID PANEL
HDL: 55
LDL Cholesterol: 156 — ABNORMAL HIGH
Triglycerides: 63
VLDL: 13

## 2010-11-27 LAB — ANTITHROMBIN III: AntiThromb III Func: 106 (ref 76–126)

## 2010-11-27 LAB — ANA: Anti Nuclear Antibody(ANA): NEGATIVE

## 2010-11-27 LAB — BASIC METABOLIC PANEL
CO2: 25
Calcium: 8.9
Chloride: 106
GFR calc Af Amer: >60
Sodium: 136

## 2010-11-27 LAB — TROPONIN I: Troponin I: 0.02

## 2010-11-27 LAB — POCT CARDIAC MARKERS
CKMB, poc: <1 — ABNORMAL LOW
Myoglobin, poc: 58.6

## 2010-11-27 LAB — APTT
aPTT: 25
aPTT: 31

## 2010-11-27 LAB — BETA-2-GLYCOPROTEIN I ABS, IGG/M/A
Beta-2-Glycoprotein I IgA: <4 U/mL (ref <10)
Beta-2-Glycoprotein I IgM: <4 U/mL (ref <10)

## 2010-11-27 LAB — HEMOGLOBIN A1C: Mean Plasma Glucose: 105

## 2010-11-27 LAB — URINE CULTURE: Colony Count: >=100000

## 2010-11-27 LAB — TSH: TSH: 4.458

## 2010-11-27 LAB — CK TOTAL AND CKMB (NOT AT ARMC): CK, MB: 1.1

## 2010-11-27 LAB — URINE MICROSCOPIC-ADD ON

## 2010-11-27 LAB — HOMOCYSTEINE: Homocysteine: 6.8

## 2010-11-27 LAB — D-DIMER, QUANTITATIVE: D-Dimer, Quant: 0.51 — ABNORMAL HIGH

## 2010-11-27 LAB — PROTEIN C ACTIVITY: Protein C Activity: 163 % — ABNORMAL HIGH (ref 75–133)

## 2010-11-27 LAB — PREGNANCY, URINE: Preg Test, Ur: NEGATIVE

## 2010-11-27 LAB — PROTHROMBIN GENE MUTATION

## 2011-02-20 ENCOUNTER — Telehealth: Payer: Self-pay | Admitting: Family Medicine

## 2011-02-20 NOTE — Telephone Encounter (Signed)
Asked her to call her insurance company to find out what they cover

## 2011-02-20 NOTE — Telephone Encounter (Signed)
Pt's prior auth for Relpax was denied. Please advise.

## 2011-02-21 NOTE — Telephone Encounter (Signed)
Please call...friday  afternoon for evaluation of migraine headaches

## 2011-02-21 NOTE — Telephone Encounter (Signed)
Patient picked up a prescription as a cash pay patient.  She will need a refill for Vicodin for her migraine is this okay to fill?

## 2011-02-21 NOTE — Telephone Encounter (Signed)
Pt aware of appt 02/22/11 at 3:45 with Dr. Tawanna Cooler.

## 2011-02-22 ENCOUNTER — Ambulatory Visit: Payer: BC Managed Care – PPO | Admitting: Family Medicine

## 2011-02-22 DIAGNOSIS — Z0289 Encounter for other administrative examinations: Secondary | ICD-10-CM

## 2011-03-28 ENCOUNTER — Other Ambulatory Visit: Payer: Self-pay | Admitting: Family Medicine

## 2011-03-28 NOTE — Telephone Encounter (Signed)
Pt last seen 02/21/11.  Rx last filled 04/26/2010 #10x6.  Pls advise.

## 2011-07-16 ENCOUNTER — Ambulatory Visit: Payer: BC Managed Care – PPO | Admitting: Family Medicine

## 2012-05-07 ENCOUNTER — Ambulatory Visit
Admission: RE | Admit: 2012-05-07 | Discharge: 2012-05-07 | Disposition: A | Discharge status: Home / Self Care | Payer: BC Managed Care – PPO | Source: Ambulatory Visit | Attending: Family Medicine | Admitting: Family Medicine

## 2012-05-07 ENCOUNTER — Other Ambulatory Visit: Payer: Self-pay | Admitting: Family Medicine

## 2012-05-07 DIAGNOSIS — R1031 Right lower quadrant pain: Secondary | ICD-10-CM

## 2012-05-07 MED ORDER — IOHEXOL 300 MG/ML  SOLN
40.0000 mL | Freq: Once | INTRAMUSCULAR | Status: AC | PRN
  Administered 2012-05-07: 40 mL via ORAL

## 2012-05-07 MED ORDER — IOHEXOL 300 MG/ML  SOLN
100.0000 mL | Freq: Once | INTRAMUSCULAR | Status: AC | PRN
  Administered 2012-05-07: 100 mL via INTRAVENOUS

## 2013-09-15 IMAGING — CT CT ABD-PELV W/ CM
2 of 4 series · 17 of 46 positions shown, 19 images · IV contrast (30CC OMNI 300 & [ID] OMNI 300)
Comparison: Ultrasound the pelvis of 05/06/2012

CLINICAL DATA: Right lower quadrant pain, nausea, pelvic tenderness

CT ABDOMEN AND PELVIS WITH CONTRAST
TECHNIQUE: Multidetector CT imaging of the abdomen and pelvis was
performed following the standard protocol during bolus
administration of intravenous contrast.
Contrast: 40mL OMNIPAQUE IOHEXOL 300 MG/ML  SOLN, 100mL OMNIPAQUE
IOHEXOL 300 MG/ML  SOLN

[Series 2: abd/pelvis with · axial · 0.70mm/px · z∈[-459,-39]mm · 14 of 87 slices shown, 16 images]
[im 4/87  soft-tissue]
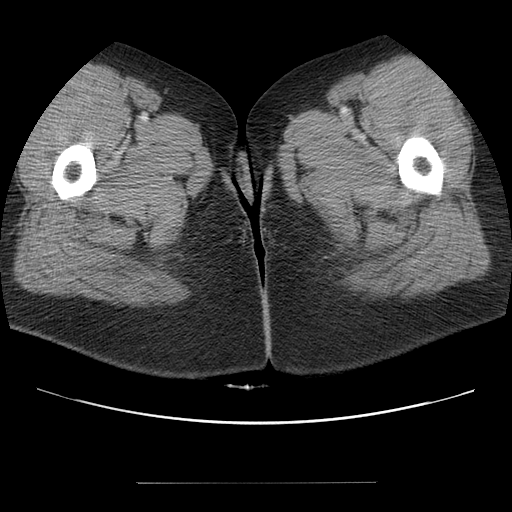
[im 4/87  bone]
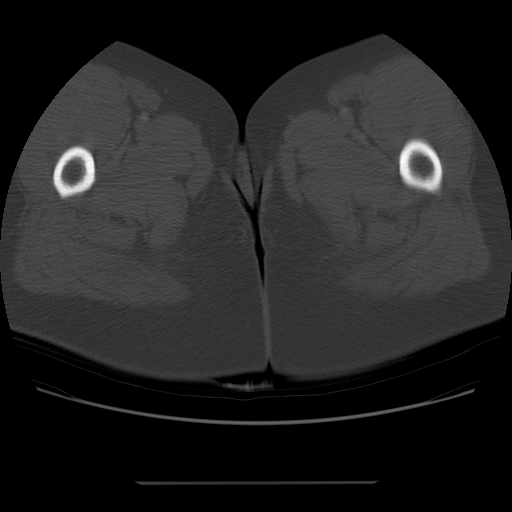
[im 11/87  soft-tissue]
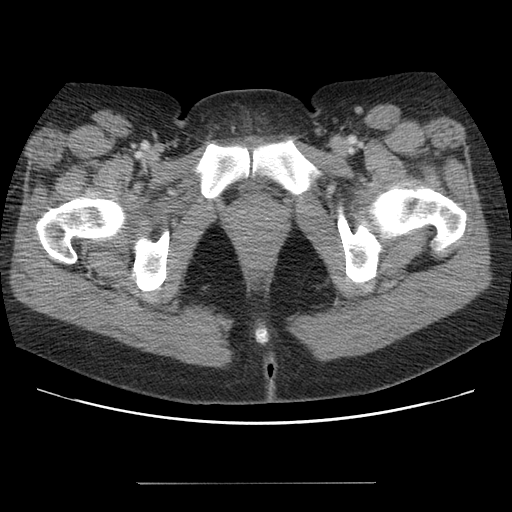
[im 18/87  soft-tissue]
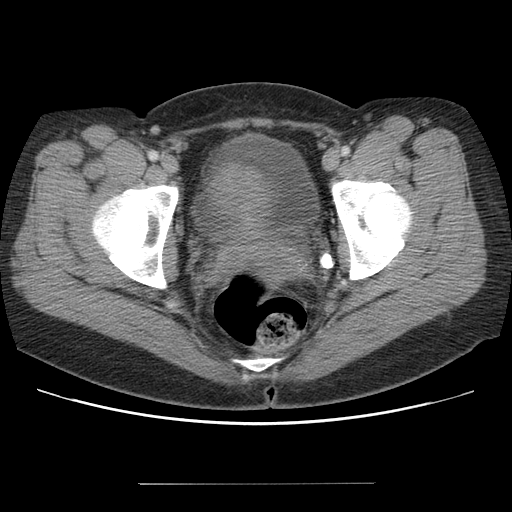
[im 25/87  soft-tissue]
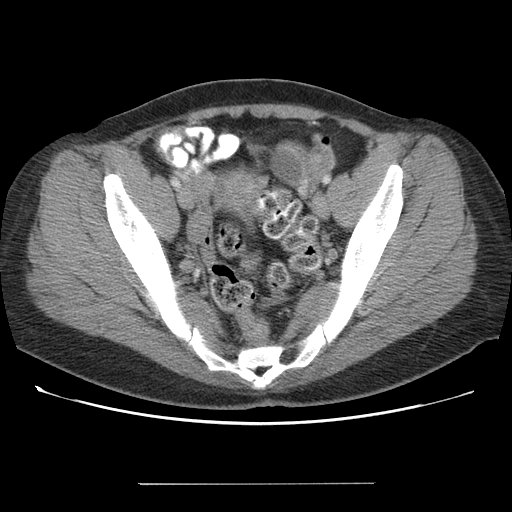
[im 28/87  soft-tissue]
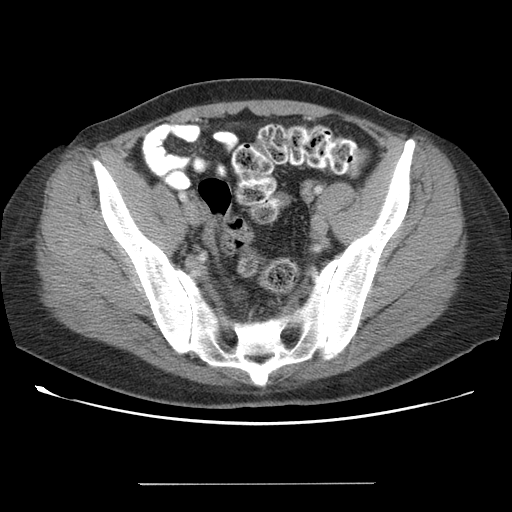
[im 35/87  soft-tissue]
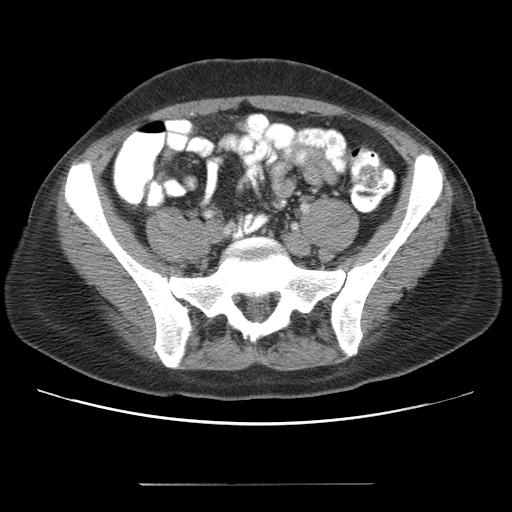
[im 42/87  soft-tissue]
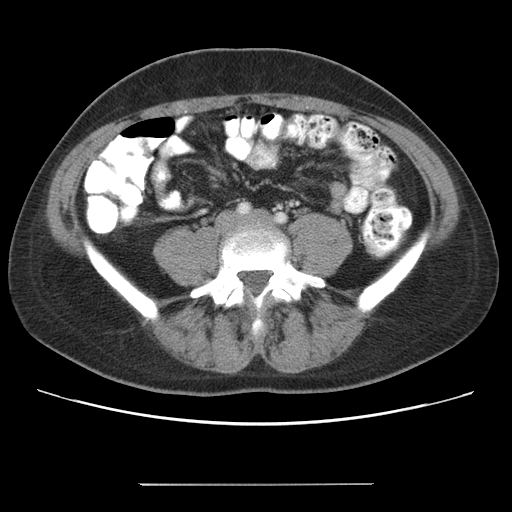
[im 45/87  soft-tissue]
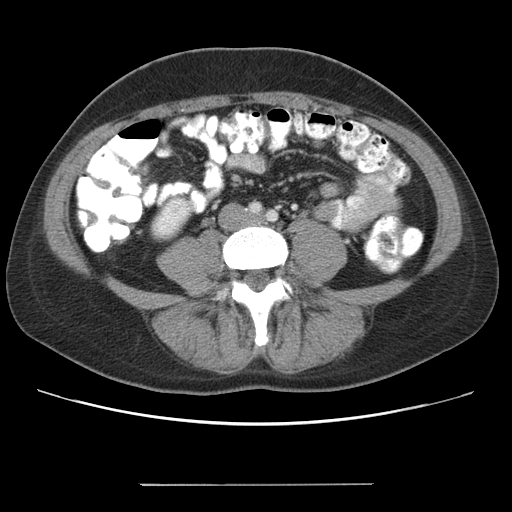
[im 52/87  soft-tissue]
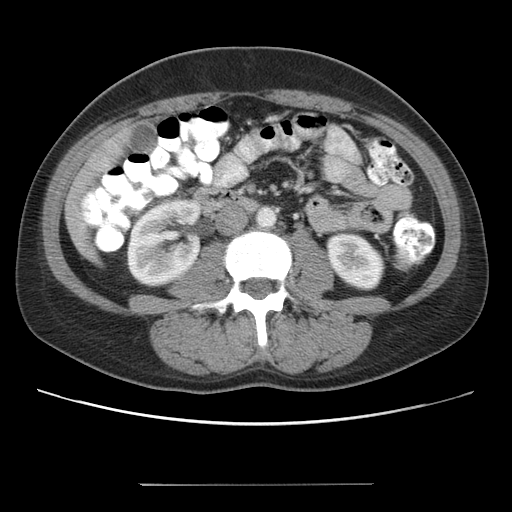
[im 52/87  bone]
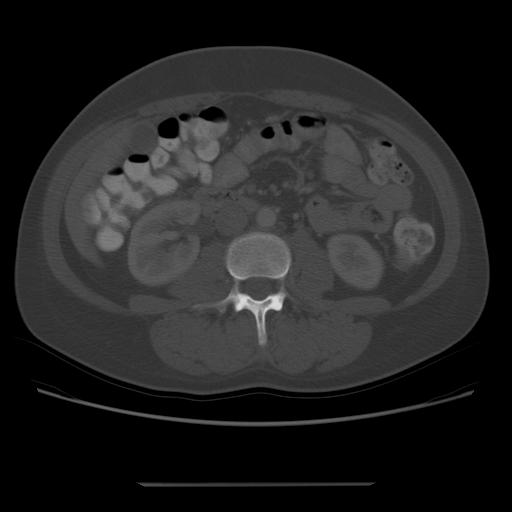
[im 59/87  soft-tissue]
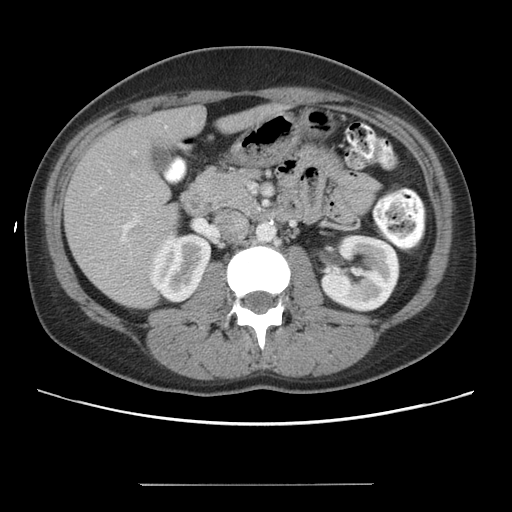
[im 66/87  soft-tissue]
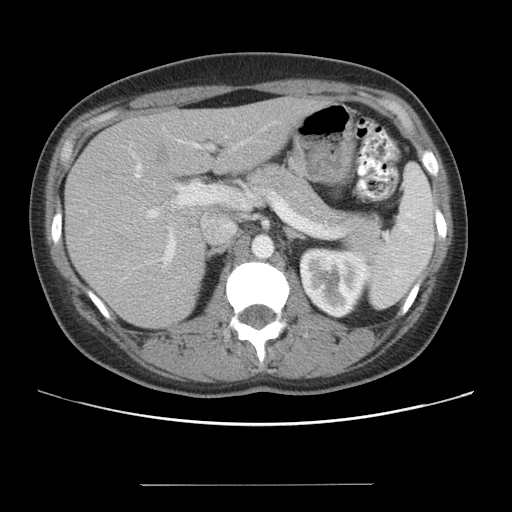
[im 69/87  soft-tissue]
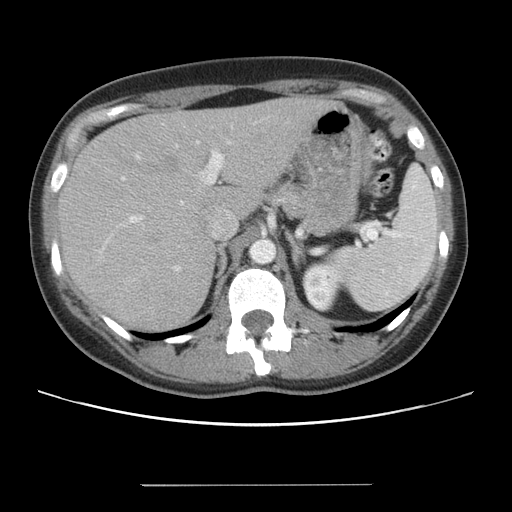
[im 76/87  soft-tissue]
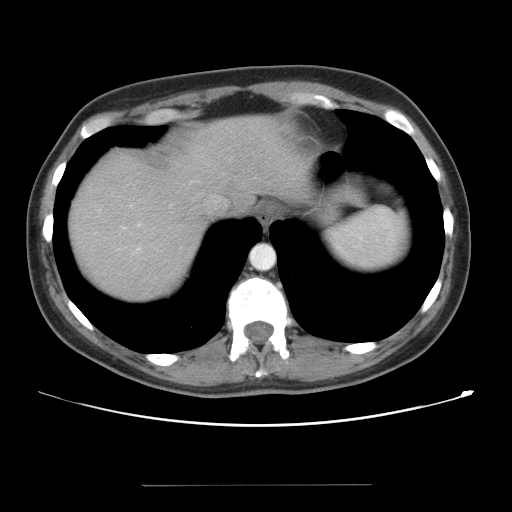
[im 83/87  soft-tissue]
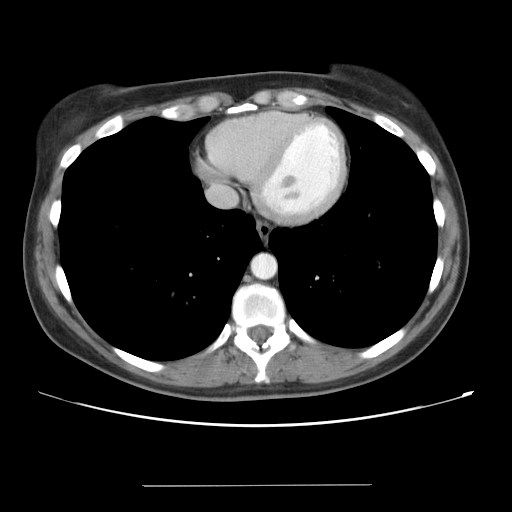

[Series 400: cor · coronal · 0.93mm/px · 3 of 144 slices shown]
[im 48/144  soft-tissue]
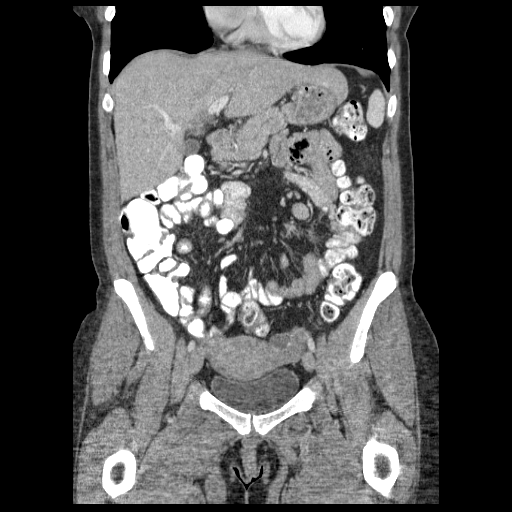
[im 64/144  soft-tissue]
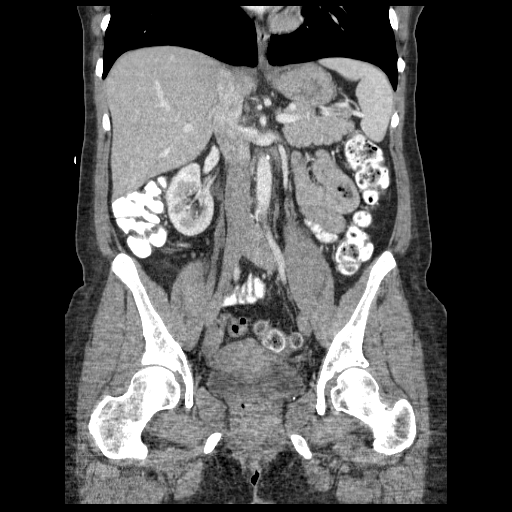
[im 80/144  soft-tissue]
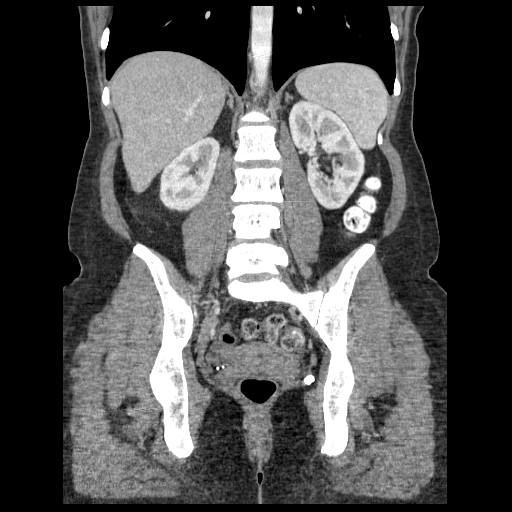

[17 of 46 positions shown; findings below may reference images not displayed]

FINDINGS: The lung bases are clear.  The liver enhances with no
focal abnormality and no ductal dilatation is seen.  No calcified
gallstones are noted.  The pancreas is normal in size and the
pancreatic duct is not dilated.  The adrenal glands and spleen are
unremarkable.  The stomach is not well distended.  The kidneys
enhance with no calculus or mass and no hydronephrosis is seen.
The abdominal aorta is normal in caliber.  No adenopathy is noted.

The urinary bladder is decompressed and cannot be well evaluated.
The uterus is anteverted. There does appear to be a collapsing left
ovarian cyst anteriorly, and there is a small amount of free fluid
in the pelvis.  Small right ovarian follicles are noted.  No
abnormality of the colon is seen.  The terminal ileum is
unremarkable.  The appendix appears somewhat shortened, but it does
fill with oral contrast and no CT evidence of appendicitis is
currently seen.  No bony abnormality is seen.
IMPRESSION: 1.  Apparent collapsing left ovarian cyst with a small amount of
free fluid in the pelvis.
2.  The appendix appears short but there is no CT evidence of acute
appendicitis.

## 2014-04-21 ENCOUNTER — Emergency Department (HOSPITAL_COMMUNITY)
Admission: EM | Admit: 2014-04-21 | Discharge: 2014-04-22 | Disposition: A | Discharge status: Home / Self Care | Payer: BLUE CROSS/BLUE SHIELD | Attending: Emergency Medicine | Admitting: Emergency Medicine

## 2014-04-21 ENCOUNTER — Encounter (HOSPITAL_COMMUNITY): Payer: Self-pay | Admitting: Family Medicine

## 2014-04-21 DIAGNOSIS — B349 Viral infection, unspecified: Secondary | ICD-10-CM

## 2014-04-21 DIAGNOSIS — R51 Headache: Secondary | ICD-10-CM | POA: Diagnosis not present

## 2014-04-21 DIAGNOSIS — M542 Cervicalgia: Secondary | ICD-10-CM | POA: Insufficient documentation

## 2014-04-21 DIAGNOSIS — R110 Nausea: Secondary | ICD-10-CM | POA: Diagnosis not present

## 2014-04-21 DIAGNOSIS — Z8679 Personal history of other diseases of the circulatory system: Secondary | ICD-10-CM | POA: Diagnosis not present

## 2014-04-21 DIAGNOSIS — M436 Torticollis: Secondary | ICD-10-CM

## 2014-04-21 DIAGNOSIS — M549 Dorsalgia, unspecified: Secondary | ICD-10-CM | POA: Diagnosis not present

## 2014-04-21 DIAGNOSIS — R519 Headache, unspecified: Secondary | ICD-10-CM

## 2014-04-21 HISTORY — DX: Migraine, unspecified, not intractable, without status migrainosus: G43.909

## 2014-04-21 LAB — COMPREHENSIVE METABOLIC PANEL
ALK PHOS: 64 U/L (ref 39–117)
ALT: 22 U/L (ref 0–35)
AST: 21 U/L (ref 0–37)
Albumin: 3.9 g/dL (ref 3.5–5.2)
Anion gap: 8 (ref 5–15)
BILIRUBIN TOTAL: 0.4 mg/dL (ref 0.3–1.2)
BUN: 11 mg/dL (ref 6–23)
CHLORIDE: 112 mmol/L (ref 96–112)
CO2: 18 mmol/L — ABNORMAL LOW (ref 19–32)
Calcium: 8.9 mg/dL (ref 8.4–10.5)
Creatinine, Ser: 0.95 mg/dL (ref 0.50–1.10)
GFR, EST AFRICAN AMERICAN: 85 mL/min — AB (ref >90)
GFR, EST NON AFRICAN AMERICAN: 73 mL/min — AB (ref >90)
GLUCOSE: 112 mg/dL — AB (ref 70–99)
Potassium: 3.9 mmol/L (ref 3.5–5.1)
Sodium: 138 mmol/L (ref 135–145)
Total Protein: 6.4 g/dL (ref 6.0–8.3)

## 2014-04-21 LAB — CBC WITH DIFFERENTIAL/PLATELET
BASOS ABS: 0 10*3/uL (ref 0.0–0.1)
BASOS PCT: 0 % (ref 0–1)
EOS ABS: 0.1 10*3/uL (ref 0.0–0.7)
Eosinophils Relative: 1 % (ref 0–5)
HCT: 36.8 % (ref 36.0–46.0)
Hemoglobin: 12.6 g/dL (ref 12.0–15.0)
LYMPHS ABS: 2.1 10*3/uL (ref 0.7–4.0)
Lymphocytes Relative: 22 % (ref 12–46)
MCH: 29.6 pg (ref 26.0–34.0)
MCHC: 34.2 g/dL (ref 30.0–36.0)
MCV: 86.6 fL (ref 78.0–100.0)
Monocytes Absolute: 0.7 10*3/uL (ref 0.1–1.0)
Monocytes Relative: 7 % (ref 3–12)
NEUTROS PCT: 70 % (ref 43–77)
Neutro Abs: 6.5 10*3/uL (ref 1.7–7.7)
PLATELETS: 211 10*3/uL (ref 150–400)
RBC: 4.25 MIL/uL (ref 3.87–5.11)
RDW: 12.4 % (ref 11.5–15.5)
WBC: 9.4 10*3/uL (ref 4.0–10.5)

## 2014-04-21 MED ORDER — DIPHENHYDRAMINE HCL 50 MG/ML IJ SOLN
25.0000 mg | Freq: Once | INTRAMUSCULAR | Status: AC
  Administered 2014-04-21: 25 mg via INTRAVENOUS
  Filled 2014-04-21: qty 1

## 2014-04-21 MED ORDER — KETOROLAC TROMETHAMINE 30 MG/ML IJ SOLN
30.0000 mg | Freq: Once | INTRAMUSCULAR | Status: AC
  Administered 2014-04-21: 30 mg via INTRAVENOUS
  Filled 2014-04-21: qty 1

## 2014-04-21 MED ORDER — SODIUM CHLORIDE 0.9 % IV BOLUS (SEPSIS)
1000.0000 mL | Freq: Once | INTRAVENOUS | Status: AC
  Administered 2014-04-21: 1000 mL via INTRAVENOUS

## 2014-04-21 MED ORDER — METOCLOPRAMIDE HCL 5 MG/ML IJ SOLN
10.0000 mg | Freq: Once | INTRAMUSCULAR | Status: AC
  Administered 2014-04-21: 10 mg via INTRAVENOUS
  Filled 2014-04-21: qty 2

## 2014-04-21 NOTE — ED Notes (Signed)
Pt here for headache, stiff neck, upper back pain and nausea.

## 2014-04-21 NOTE — ED Provider Notes (Signed)
CSN: [id number]     Arrival date & time 04/21/14  1938 History   First MD Initiated Contact with Patient 04/21/14 2025     Chief Complaint  Patient presents with  . Neck Pain  . Back Pain  . Nausea     (Consider location/radiation/quality/duration/timing/severity/associated sxs/prior Treatment) HPI Comments:  Patient with past medical history remarkable for migraines, presents emergency department with chief complaint of headache, neck stiffness, back pain, and nausea. She states that the symptoms started yesterday and have progressively worsened today. She states that her headache is severe. She states that she has felt warm, but has not measured a temperature.  She states that she has pain in her neck which is worsened with neck flexion. She states that the pain radiates down her back.   She denies any chest pain, shortness of breath , abdominal pain, or vomiting. She states that she just started her menstrual cycle.  The history is provided by the patient. No language interpreter was used.    Past Medical History  Diagnosis Date  . Migraine    Past Surgical History  Procedure Laterality Date  . Appendectomy     History reviewed. No pertinent family history. History  Substance Use Topics  . Smoking status: Never Smoker   . Smokeless tobacco: Not on file  . Alcohol Use: No   OB History    No data available     Review of Systems  Constitutional: Negative for fever and chills.  Respiratory: Negative for shortness of breath.   Cardiovascular: Negative for chest pain.  Gastrointestinal: Negative for nausea, vomiting, diarrhea and constipation.  Genitourinary: Negative for dysuria.  Neurological: Positive for headaches.  All other systems reviewed and are negative.     Allergies  Review of patient's allergies indicates no known allergies.  Home Medications   Prior to Admission medications   Not on File   BP 122/70 mmHg  Pulse 101  Temp(Src) 98.7 F (37.1 C)  (Oral)  Resp 18  Wt 148 lb (67.132 kg)  SpO2 100%  LMP 04/21/2014 Physical Exam  Constitutional: She is oriented to person, place, and time. She appears well-developed and well-nourished.  HENT:  Head: Normocephalic and atraumatic.  Right Ear: External ear normal.  Left Ear: External ear normal.   Bilateral tympanic membranes are clear, oropharynx is clear  Eyes: Conjunctivae and EOM are normal. Pupils are equal, round, and reactive to light.  Neck: Normal range of motion. Neck supple.   Pain with neck flexion, cervical paraspinal muscles and upper trapezius muscles are tender to palpation  Cardiovascular: Normal rate, regular rhythm and normal heart sounds.  Exam reveals no gallop and no friction rub.   No murmur heard. Pulmonary/Chest: Effort normal and breath sounds normal. No respiratory distress. She has no wheezes. She has no rales. She exhibits no tenderness.  Abdominal: Soft. She exhibits no distension and no mass. There is no tenderness. There is no rebound and no guarding.   Abdomen is soft and nontender to palpation  Musculoskeletal: Normal range of motion. She exhibits no edema or tenderness.  Normal gait.  Neurological: She is alert and oriented to person, place, and time. She has normal reflexes.  CN 3-12 intact, normal finger to nose, no pronator drift, sensation and strength intact bilaterally.  Skin: Skin is warm and dry.  Psychiatric: She has a normal mood and affect. Her behavior is normal. Judgment and thought content normal.  Nursing note and vitals reviewed.   ED Course  LUMBAR PUNCTURE Date/Time: 04/21/2014 11:10 PM Performed by: Roxy Horseman Authorized by: Roxy Horseman Consent: The procedure was performed in an emergent situation. Verbal consent obtained. Written consent obtained. Risks and benefits: risks, benefits and alternatives were discussed Consent given by: patient Patient understanding: patient states understanding of the procedure being  performed Patient consent: the patient's understanding of the procedure matches consent given Procedure consent: procedure consent matches procedure scheduled Relevant documents: relevant documents present and verified Test results: test results available and properly labeled Site marked: the operative site was marked Imaging studies: imaging studies available Required items: required blood products, implants, devices, and special equipment available Patient identity confirmed: verbally with patient Time out: Immediately prior to procedure a "time out" was called to verify the correct patient, procedure, equipment, support staff and site/side marked as required. Indications: evaluation for infection Anesthesia: local infiltration Local anesthetic: lidocaine 1% without epinephrine Anesthetic total: 4 ml Patient sedated: no Preparation: Patient was prepped and draped in the usual sterile fashion. Lumbar space: L4-L5 interspace Patient's position: sitting Needle gauge: 18 Needle type: spinal needle - Quincke tip Needle length: 3.5 in Number of attempts: 1 Fluid appearance: clear Tubes of fluid: 4 Total volume: 9 ml Post-procedure: site cleaned and adhesive bandage applied Patient tolerance: Patient tolerated the procedure well with no immediate complications Comments: Patient tolerated procedure well.  Clear CSF was obtained on 1st attempt. Patient placed lying supine following procedure.   (including critical care time) Labs Review Labs Reviewed  CBC WITH DIFFERENTIAL/PLATELET  COMPREHENSIVE METABOLIC PANEL    Imaging Review No results found.   EKG Interpretation None      MDM   Final diagnoses:  Headache, unspecified headache type  Neck stiffness  Nausea  Viral syndrome     patient with headache, neck pain, and nausea. Give headache cocktail. Patient is afebrile. Less suspicious of meningitis. Normal white blood cell count. Will reassess.  Patient still feels  severe neck stiffness despite headache cocktail.   My suspicion for meningitis is still very low. However, will perform lumbar puncture and obtain CSF samples to rule this out completely. Patient states that she would prefer to have this done.  Patient seen by and discussed with Dr. Gwendolyn Grant, who is agreeable with the plan.  CSF samples obtained.  Clear CSF.  Labs pending.  Patient signed out to Piepenbrink, PA-C, who will follow-up on CSF results.  If negative, DC to home.    Roxy Horseman, PA-C 04/22/14 1610  Elwin Mocha, MD 04/26/14 1044

## 2014-04-21 NOTE — ED Notes (Signed)
Pt states she still feeling her neck very stiff and the burning sensation all over her spine and back.

## 2014-04-22 LAB — CSF CELL COUNT WITH DIFFERENTIAL
RBC COUNT CSF: 5 /mm3 — AB
RBC Count, CSF: 114 /mm3 — ABNORMAL HIGH
TUBE #: 1
Tube #: 4
WBC CSF: 0 /mm3 (ref 0–5)
WBC, CSF: 2 /mm3 (ref 0–5)

## 2014-04-22 LAB — GRAM STAIN

## 2014-04-22 LAB — PROTEIN, CSF: TOTAL PROTEIN, CSF: 37 mg/dL (ref 15–45)

## 2014-04-22 LAB — GLUCOSE, CSF: Glucose, CSF: 58 mg/dL (ref 43–76)

## 2014-04-22 NOTE — ED Provider Notes (Signed)
Results for orders placed or performed during the hospital encounter of 04/21/14  CBC WITH DIFFERENTIAL  Result Value Ref Range   WBC 9.4 4.0 - 10.5 K/uL   RBC 4.25 3.87 - 5.11 MIL/uL   Hemoglobin 12.6 12.0 - 15.0 g/dL   HCT 40.936.8 81.136.0 - 91.446.0 %   MCV 86.6 78.0 - 100.0 fL   MCH 29.6 26.0 - 34.0 pg   MCHC 34.2 30.0 - 36.0 g/dL   RDW 78.212.4 95.611.5 - 21.315.5 %   Platelets 211 150 - 400 K/uL   Neutrophils Relative % 70 43 - 77 %   Neutro Abs 6.5 1.7 - 7.7 K/uL   Lymphocytes Relative 22 12 - 46 %   Lymphs Abs 2.1 0.7 - 4.0 K/uL   Monocytes Relative 7 3 - 12 %   Monocytes Absolute 0.7 0.1 - 1.0 K/uL   Eosinophils Relative 1 0 - 5 %   Eosinophils Absolute 0.1 0.0 - 0.7 K/uL   Basophils Relative 0 0 - 1 %   Basophils Absolute 0.0 0.0 - 0.1 K/uL  Comprehensive metabolic panel  Result Value Ref Range   Sodium 138 135 - 145 mmol/L   Potassium 3.9 3.5 - 5.1 mmol/L   Chloride 112 96 - 112 mmol/L   CO2 18 (L) 19 - 32 mmol/L   Glucose, Bld 112 (H) 70 - 99 mg/dL   BUN 11 6 - 23 mg/dL   Creatinine, Ser 0.860.95 0.50 - 1.10 mg/dL   Calcium 8.9 8.4 - 57.810.5 mg/dL   Total Protein 6.4 6.0 - 8.3 g/dL   Albumin 3.9 3.5 - 5.2 g/dL   AST 21 0 - 37 U/L   ALT 22 0 - 35 U/L   Alkaline Phosphatase 64 39 - 117 U/L   Total Bilirubin 0.4 0.3 - 1.2 mg/dL   GFR calc non Af Amer 73 (L) >90 mL/min   GFR calc Af Amer 85 (L) >90 mL/min   Anion gap 8 5 - 15  CSF cell count with differential collection tube #: 1  Result Value Ref Range   Tube # 1    Color, CSF COLORLESS COLORLESS   Appearance, CSF CLEAR CLEAR   Supernatant NOT INDICATED    RBC Count, CSF 114 (H) 0 /cu mm   WBC, CSF 2 0 - 5 /cu mm   Segmented Neutrophils-CSF RARE 0 - 6 %   Lymphs, CSF FEW 40 - 80 %   Monocyte-Macrophage-Spinal Fluid RARE 15 - 45 %  CSF cell count with differential collection tube #: 4  Result Value Ref Range   Tube # 4    Color, CSF COLORLESS COLORLESS   Appearance, CSF CLEAR CLEAR   Supernatant NOT INDICATED    RBC Count, CSF 5  (H) 0 /cu mm   WBC, CSF 0 0 - 5 /cu mm   Segmented Neutrophils-CSF TOO FEW TO COUNT, SMEAR AVAILABLE FOR REVIEW 0 - 6 %  Glucose, CSF  Result Value Ref Range   Glucose, CSF 58 43 - 76 mg/dL  Protein, CSF  Result Value Ref Range   Total  Protein, CSF 37 15 - 45 mg/dL   No results found.  1. Headache, unspecified headache type   2. Neck stiffness   3. Nausea   4. Viral syndrome     No evidence of meningitis or other acute intracranial process from LP results will discharge patient home. Patient is stable at time of discharge   Jeannetta EllisJennifer L Lashe Oliveira, PA-C 04/22/14 [phone]  Lyanne Co, MD 04/22/14 [phone]

## 2014-04-22 NOTE — Discharge Instructions (Signed)
Viral Infections A virus is a type of germ. Viruses can cause:  Minor sore throats.  Aches and pains.  Headaches.  Runny nose.  Rashes.  Watery eyes.  Tiredness.  Coughs.  Loss of appetite.  Feeling sick to your stomach (nausea).  Throwing up (vomiting).  Watery poop (diarrhea). HOME CARE   Only take medicines as told by your doctor.  Drink enough water and fluids to keep your pee (urine) clear or pale yellow. Sports drinks are a good choice.  Get plenty of rest and eat healthy. Soups and broths with crackers or rice are fine. GET HELP RIGHT AWAY IF:   You have a very bad headache.  You have shortness of breath.  You have chest pain or neck pain.  You have an unusual rash.  You cannot stop throwing up.  You have watery poop that does not stop.  You cannot keep fluids down.  You or your child has a temperature by mouth above 102 F (38.9 C), not controlled by medicine.  Your baby is older than 3 months with a rectal temperature of 102 F (38.9 C) or higher.  Your baby is 303 months old or younger with a rectal temperature of 100.4 F (38 C) or higher. MAKE SURE YOU:   Understand these instructions.  Will watch this condition.  Will get help right away if you are not doing well or get worse. Document Released: 01/25/2008 Document Revised: 05/06/2011 Document Reviewed: 06/19/2010 [facility] Patient Information 2015 [place], [place]. This information is not intended to replace advice given to you by your health care provider. Make sure you discuss any questions you have with your health care provider. General Headache Without Cause A headache is pain or discomfort felt around the head or neck area. The specific cause of a headache may not be found. There are many causes and types of headaches. A few common ones are:  Tension headaches.  Migraine headaches.  Cluster headaches.  Chronic daily headaches. HOME CARE INSTRUCTIONS   Keep all follow-up  appointments with your caregiver or any specialist referral.  Only take over-the-counter or prescription medicines for pain or discomfort as directed by your caregiver.  Lie down in a dark, quiet room when you have a headache.  Keep a headache journal to find out what may trigger your migraine headaches. For example, write down:  What you eat and drink.  How much sleep you get.  Any change to your diet or medicines.  Try massage or other relaxation techniques.  Put ice packs or heat on the head and neck. Use these 3 to 4 times per day for 15 to 20 minutes each time, or as needed.  Limit stress.  Sit up straight, and do not tense your muscles.  Quit smoking if you smoke.  Limit alcohol use.  Decrease the amount of caffeine you drink, or stop drinking caffeine.  Eat and sleep on a regular schedule.  Get 7 to 9 hours of sleep, or as recommended by your caregiver.  Keep lights dim if bright lights bother you and make your headaches worse. SEEK MEDICAL CARE IF:   You have problems with the medicines you were prescribed.  Your medicines are not working.  You have a change from the usual headache.  You have nausea or vomiting. SEEK IMMEDIATE MEDICAL CARE IF:   Your headache becomes severe.  You have a fever.  You have a stiff neck.  You have loss of vision.  You have muscular weakness  or loss of muscle control.  You start losing your balance or have trouble walking.  You feel faint or pass out.  You have severe symptoms that are different from your first symptoms. MAKE SURE YOU:   Understand these instructions.  Will watch your condition.  Will get help right away if you are not doing well or get worse. Document Released: 02/11/2005 Document Revised: 05/06/2011 Document Reviewed: 02/27/2011 [facility] Patient Information 2015 [place], [place]. This information is not intended to replace advice given to you by your health care provider. Make sure you discuss  any questions you have with your health care provider.

## 2014-04-25 LAB — CSF CULTURE

## 2014-04-25 LAB — CSF CULTURE W GRAM STAIN: Culture: NO GROWTH

## 2014-12-28 ENCOUNTER — Encounter: Payer: BLUE CROSS/BLUE SHIELD | Admitting: Obstetrics and Gynecology

## 2015-05-17 DIAGNOSIS — F411 Generalized anxiety disorder: Secondary | ICD-10-CM | POA: Insufficient documentation

## 2015-05-27 DIAGNOSIS — R05 Cough: Secondary | ICD-10-CM | POA: Diagnosis not present

## 2015-10-04 DIAGNOSIS — G43909 Migraine, unspecified, not intractable, without status migrainosus: Secondary | ICD-10-CM | POA: Diagnosis not present

## 2015-10-04 DIAGNOSIS — M62838 Other muscle spasm: Secondary | ICD-10-CM | POA: Diagnosis not present

## 2015-10-11 DIAGNOSIS — F331 Major depressive disorder, recurrent, moderate: Secondary | ICD-10-CM | POA: Diagnosis not present

## 2015-11-15 DIAGNOSIS — F900 Attention-deficit hyperactivity disorder, predominantly inattentive type: Secondary | ICD-10-CM | POA: Diagnosis not present

## 2015-11-15 DIAGNOSIS — F3342 Major depressive disorder, recurrent, in full remission: Secondary | ICD-10-CM | POA: Diagnosis not present

## 2015-11-15 DIAGNOSIS — F410 Panic disorder [episodic paroxysmal anxiety] without agoraphobia: Secondary | ICD-10-CM | POA: Diagnosis not present

## 2016-03-08 DIAGNOSIS — M545 Low back pain: Secondary | ICD-10-CM | POA: Diagnosis not present

## 2016-04-05 DIAGNOSIS — G4709 Other insomnia: Secondary | ICD-10-CM | POA: Diagnosis not present

## 2016-04-05 DIAGNOSIS — F410 Panic disorder [episodic paroxysmal anxiety] without agoraphobia: Secondary | ICD-10-CM | POA: Diagnosis not present

## 2016-04-05 DIAGNOSIS — F332 Major depressive disorder, recurrent severe without psychotic features: Secondary | ICD-10-CM | POA: Diagnosis not present

## 2016-04-10 DIAGNOSIS — M549 Dorsalgia, unspecified: Secondary | ICD-10-CM | POA: Diagnosis not present

## 2016-04-10 DIAGNOSIS — G43909 Migraine, unspecified, not intractable, without status migrainosus: Secondary | ICD-10-CM | POA: Diagnosis not present

## 2016-06-28 DIAGNOSIS — F3342 Major depressive disorder, recurrent, in full remission: Secondary | ICD-10-CM | POA: Diagnosis not present

## 2016-12-20 DIAGNOSIS — F3342 Major depressive disorder, recurrent, in full remission: Secondary | ICD-10-CM | POA: Diagnosis not present

## 2017-06-06 DIAGNOSIS — F410 Panic disorder [episodic paroxysmal anxiety] without agoraphobia: Secondary | ICD-10-CM | POA: Diagnosis not present

## 2017-06-06 DIAGNOSIS — F3342 Major depressive disorder, recurrent, in full remission: Secondary | ICD-10-CM | POA: Diagnosis not present

## 2017-07-31 ENCOUNTER — Encounter: Payer: Self-pay | Admitting: Emergency Medicine

## 2017-07-31 ENCOUNTER — Other Ambulatory Visit: Payer: Self-pay

## 2017-07-31 ENCOUNTER — Emergency Department
Admission: EM | Admit: 2017-07-31 | Discharge: 2017-08-01 | Disposition: A | Discharge status: Home / Self Care | Payer: BLUE CROSS/BLUE SHIELD | Attending: Emergency Medicine | Admitting: Emergency Medicine

## 2017-07-31 DIAGNOSIS — J029 Acute pharyngitis, unspecified: Secondary | ICD-10-CM | POA: Diagnosis not present

## 2017-07-31 DIAGNOSIS — J069 Acute upper respiratory infection, unspecified: Secondary | ICD-10-CM | POA: Insufficient documentation

## 2017-07-31 DIAGNOSIS — R0981 Nasal congestion: Secondary | ICD-10-CM | POA: Diagnosis not present

## 2017-07-31 LAB — GROUP A STREP BY PCR: Group A Strep by PCR: NOT DETECTED

## 2017-07-31 MED ORDER — OXYMETAZOLINE HCL 0.05 % NA SOLN
2.0000 | Freq: Once | NASAL | Status: AC
  Administered 2017-07-31: 2 via NASAL
  Filled 2017-07-31: qty 15

## 2017-07-31 MED ORDER — DEXAMETHASONE 4 MG PO TABS
8.0000 mg | ORAL_TABLET | Freq: Once | ORAL | Status: AC
  Administered 2017-07-31: 8 mg via ORAL
  Filled 2017-07-31: qty 2

## 2017-07-31 NOTE — ED Triage Notes (Signed)
Patient ambulatory to triage with steady gait, without difficulty or distress noted, mask in place; pt reports since this afternoon having sinus congestion/pressure, sore throat

## 2017-07-31 NOTE — ED Provider Notes (Signed)
[facility] Emergency Department Provider Note  Time seen: 10:51 PM  I have reviewed the triage vital signs and the nursing notes.   HISTORY  Chief Complaint Sore Throat and Nasal Congestion    HPI Romilda Garretatricia Arch is a 47 y.o. female with no significant past medical history presents to the emergency department for congestion.  According to the patient this morning she had a sore throat, which has progressed rapidly she says throughout the day to now significant nasal and sinus congestion.  States she is having trouble breathing through her nose because of all the congestion and feels like her throat pain is getting worse.  Denies any fever.  Denies any nausea or vomiting.  No chest pain no cough.   Past Medical History:  Diagnosis Date  . Migraine     There are no active problems to display for this patient.   Past Surgical History:  Procedure Laterality Date  . APPENDECTOMY      Prior to Admission medications   Not on File    No Known Allergies  No family history on file.  Social History Social History   Tobacco Use  . Smoking status: Never Smoker  . Smokeless tobacco: Never Used  Substance Use Topics  . Alcohol use: No  . Drug use: No    Review of Systems Constitutional: Negative for fever. ENT: Positive for congestion Cardiovascular: Negative for chest pain. Respiratory: Negative for shortness of breath.  Negative for cough Gastrointestinal: Negative for abdominal pain, vomiting Musculoskeletal: Negative for musculoskeletal complaints Neurological: Negative for headache All other ROS negative  ____________________________________________   PHYSICAL EXAM:  VITAL SIGNS: ED Triage Vitals  Enc Vitals Group     BP 07/31/17 2236 134/78     Pulse Rate 07/31/17 2236 89     Resp 07/31/17 2236 18     Temp 07/31/17 2236 98 F (36.7 C)     Temp Source 07/31/17 2236 Oral     SpO2 07/31/17 2236 97 %     Weight 07/31/17 2237 170  lb (77.1 kg)     Height 07/31/17 2237 5\' 6"  (1.676 m)     Head Circumference --      Peak Flow --      Pain Score 07/31/17 2236 7     Pain Loc --      Pain Edu? --      Excl. in GC? --    Constitutional: Alert and oriented. Well appearing and in no distress. Eyes: Normal exam ENT   Head: Normocephalic and atraumatic.   Nose: Moderate congestion   Mouth/Throat: Mucous membranes are moist.  Mild pharyngeal erythema without tonsillar hypertrophy, midline uvula, no edema noted Cardiovascular: Normal rate, regular rhythm. No murmur Respiratory: Normal respiratory effort without tachypnea nor retractions. Breath sounds are clear  Gastrointestinal: Soft and nontender. No distention Musculoskeletal: Nontender with normal range of motion in all extremities.  Neurologic:  Normal speech and language. No gross focal neurologic deficits Skin:  Skin is warm, dry and intact.  Psychiatric: Mood and affect are normal.   ____________________________________________  INITIAL IMPRESSION / ASSESSMENT AND PLAN / ED COURSE  Pertinent labs & imaging results that were available during my care of the patient were reviewed by me and considered in my medical decision making (see chart for details).  Patient presents to the emergency department for sore throat and congestion.  Symptoms started this morning.  Overall the patient appears very well, she does have moderate congestion on exam  mild erythema of the pharynx on exam.  Strep swab has been sent.  We will dose Afrin as well as a one-time dose of Decadron.  Overall the patient appears well.  If the patient's strep is negative we will discharge with Afrin to be used no longer than 3 days and follow-up with her doctor.  If the strep is positive we will placed on antibiotics.  Patient agreeable to plan of care.  Patient care signed out to oncoming physician.  ____________________________________________   FINAL CLINICAL IMPRESSION(S) / ED  DIAGNOSES  Upper respiratory infection    Minna Antis, MD 07/31/17 2254

## 2017-08-01 MED ORDER — AMOXICILLIN-POT CLAVULANATE 875-125 MG PO TABS
1.0000 | ORAL_TABLET | Freq: Once | ORAL | Status: AC
Start: 2017-08-01 — End: 2017-08-01
  Administered 2017-08-01: 1 via ORAL
  Filled 2017-08-01: qty 1

## 2017-08-01 MED ORDER — AMOXICILLIN-POT CLAVULANATE 875-125 MG PO TABS: 1.0000 | ORAL_TABLET | Freq: Two times a day (BID) | ORAL | 0 refills | Status: AC

## 2017-08-01 MED ORDER — IBUPROFEN 400 MG PO TABS
400.0000 mg | ORAL_TABLET | Freq: Once | ORAL | Status: AC
  Administered 2017-08-01: 400 mg via ORAL
  Filled 2017-08-01: qty 1

## 2017-08-01 NOTE — ED Provider Notes (Signed)
I assumed care of the patient from Dr. Lenard LancePaduchowski rapid strep negative.  Patient with congestion with concern for sinusitis and pharyngitis   Darci CurrentBrown, Weldon N, MD 08/01/17 0009

## 2017-12-01 DIAGNOSIS — F900 Attention-deficit hyperactivity disorder, predominantly inattentive type: Secondary | ICD-10-CM | POA: Diagnosis not present

## 2017-12-01 DIAGNOSIS — F3342 Major depressive disorder, recurrent, in full remission: Secondary | ICD-10-CM | POA: Diagnosis not present

## 2017-12-12 ENCOUNTER — Encounter: Payer: Self-pay | Admitting: Physician Assistant

## 2017-12-12 ENCOUNTER — Ambulatory Visit: Payer: Self-pay | Admitting: Physician Assistant

## 2017-12-12 ENCOUNTER — Ambulatory Visit: Payer: BLUE CROSS/BLUE SHIELD | Admitting: Physician Assistant

## 2017-12-12 VITALS — BP 96/66 | HR 101 | Temp 98.1°F | Resp 16 | Ht 65.0 in | Wt 183.0 lb

## 2017-12-12 DIAGNOSIS — G43809 Other migraine, not intractable, without status migrainosus: Secondary | ICD-10-CM | POA: Diagnosis not present

## 2017-12-12 DIAGNOSIS — F329 Major depressive disorder, single episode, unspecified: Secondary | ICD-10-CM | POA: Diagnosis not present

## 2017-12-12 DIAGNOSIS — N938 Other specified abnormal uterine and vaginal bleeding: Secondary | ICD-10-CM | POA: Diagnosis not present

## 2017-12-12 MED ORDER — RIZATRIPTAN BENZOATE 10 MG PO TABS: ORAL_TABLET | ORAL | 0 refills | Status: DC
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
## 2017-12-15 ENCOUNTER — Encounter: Payer: Self-pay | Admitting: Physician Assistant

## 2018-01-28 ENCOUNTER — Encounter: Payer: Self-pay | Admitting: Physician Assistant

## 2018-01-31 ENCOUNTER — Other Ambulatory Visit: Payer: Self-pay | Admitting: Physician Assistant

## 2018-01-31 DIAGNOSIS — G43809 Other migraine, not intractable, without status migrainosus: Secondary | ICD-10-CM

## 2018-03-25 ENCOUNTER — Telehealth: Payer: Self-pay | Admitting: Physician Assistant

## 2018-03-25 NOTE — Telephone Encounter (Signed)
Reviewed OBGYN records showing negative endometrial biopsy in 2016. No record of PAP in the past three years. Can we call this patient to offer her a CPE with PAP smear please?

## 2018-03-25 NOTE — Telephone Encounter (Signed)
Patient advised as below. Will call back later to schedule appt

## 2018-04-15 ENCOUNTER — Other Ambulatory Visit: Payer: Self-pay | Admitting: Physician Assistant

## 2018-04-15 DIAGNOSIS — G43809 Other migraine, not intractable, without status migrainosus: Secondary | ICD-10-CM

## 2018-04-22 DIAGNOSIS — M9903 Segmental and somatic dysfunction of lumbar region: Secondary | ICD-10-CM | POA: Diagnosis not present

## 2018-04-22 DIAGNOSIS — M62411 Contracture of muscle, right shoulder: Secondary | ICD-10-CM | POA: Diagnosis not present

## 2018-04-22 DIAGNOSIS — M9907 Segmental and somatic dysfunction of upper extremity: Secondary | ICD-10-CM | POA: Diagnosis not present

## 2018-04-22 DIAGNOSIS — M5412 Radiculopathy, cervical region: Secondary | ICD-10-CM | POA: Diagnosis not present

## 2018-04-22 DIAGNOSIS — M9901 Segmental and somatic dysfunction of cervical region: Secondary | ICD-10-CM | POA: Diagnosis not present

## 2018-04-22 DIAGNOSIS — M62830 Muscle spasm of back: Secondary | ICD-10-CM | POA: Diagnosis not present

## 2018-04-24 DIAGNOSIS — M62830 Muscle spasm of back: Secondary | ICD-10-CM | POA: Diagnosis not present

## 2018-04-24 DIAGNOSIS — M9907 Segmental and somatic dysfunction of upper extremity: Secondary | ICD-10-CM | POA: Diagnosis not present

## 2018-04-24 DIAGNOSIS — M9901 Segmental and somatic dysfunction of cervical region: Secondary | ICD-10-CM | POA: Diagnosis not present

## 2018-04-24 DIAGNOSIS — M5412 Radiculopathy, cervical region: Secondary | ICD-10-CM | POA: Diagnosis not present

## 2018-04-24 DIAGNOSIS — M9903 Segmental and somatic dysfunction of lumbar region: Secondary | ICD-10-CM | POA: Diagnosis not present

## 2018-04-24 DIAGNOSIS — M62411 Contracture of muscle, right shoulder: Secondary | ICD-10-CM | POA: Diagnosis not present

## 2018-04-27 DIAGNOSIS — M62411 Contracture of muscle, right shoulder: Secondary | ICD-10-CM | POA: Diagnosis not present

## 2018-04-27 DIAGNOSIS — M9901 Segmental and somatic dysfunction of cervical region: Secondary | ICD-10-CM | POA: Diagnosis not present

## 2018-04-27 DIAGNOSIS — M9907 Segmental and somatic dysfunction of upper extremity: Secondary | ICD-10-CM | POA: Diagnosis not present

## 2018-04-27 DIAGNOSIS — M9903 Segmental and somatic dysfunction of lumbar region: Secondary | ICD-10-CM | POA: Diagnosis not present

## 2018-04-27 DIAGNOSIS — M62830 Muscle spasm of back: Secondary | ICD-10-CM | POA: Diagnosis not present

## 2018-04-27 DIAGNOSIS — M5412 Radiculopathy, cervical region: Secondary | ICD-10-CM | POA: Diagnosis not present

## 2018-04-30 DIAGNOSIS — M62830 Muscle spasm of back: Secondary | ICD-10-CM | POA: Diagnosis not present

## 2018-04-30 DIAGNOSIS — M9907 Segmental and somatic dysfunction of upper extremity: Secondary | ICD-10-CM | POA: Diagnosis not present

## 2018-04-30 DIAGNOSIS — M9901 Segmental and somatic dysfunction of cervical region: Secondary | ICD-10-CM | POA: Diagnosis not present

## 2018-04-30 DIAGNOSIS — M5412 Radiculopathy, cervical region: Secondary | ICD-10-CM | POA: Diagnosis not present

## 2018-04-30 DIAGNOSIS — M62411 Contracture of muscle, right shoulder: Secondary | ICD-10-CM | POA: Diagnosis not present

## 2018-04-30 DIAGNOSIS — M9903 Segmental and somatic dysfunction of lumbar region: Secondary | ICD-10-CM | POA: Diagnosis not present

## 2018-05-04 DIAGNOSIS — F3342 Major depressive disorder, recurrent, in full remission: Secondary | ICD-10-CM | POA: Diagnosis not present

## 2018-05-04 DIAGNOSIS — F410 Panic disorder [episodic paroxysmal anxiety] without agoraphobia: Secondary | ICD-10-CM | POA: Diagnosis not present

## 2018-05-26 ENCOUNTER — Other Ambulatory Visit: Payer: Self-pay

## 2018-05-26 ENCOUNTER — Encounter: Payer: Self-pay | Admitting: Physician Assistant

## 2018-05-26 ENCOUNTER — Ambulatory Visit (INDEPENDENT_AMBULATORY_CARE_PROVIDER_SITE_OTHER): Payer: BLUE CROSS/BLUE SHIELD | Admitting: Physician Assistant

## 2018-05-26 VITALS — BP 91/62 | HR 98 | Temp 98.0°F | Resp 16 | Wt 178.2 lb

## 2018-05-26 DIAGNOSIS — N920 Excessive and frequent menstruation with regular cycle: Secondary | ICD-10-CM

## 2018-05-26 NOTE — Patient Instructions (Signed)
Menorrhagia - endometrial biopsy negative in 2016, increased bleeding recently.

## 2018-05-26 NOTE — Progress Notes (Signed)
Patient: Charlene Freeman Female    DOB: 1970/07/30   48 y.o.   MRN: [id number] Visit Date: 05/26/2018  Today's Provider: Trey Sailors, PA-C   Chief Complaint  Patient presents with  . Irregular Menstruation   Subjective:     HPI  Irregular Menstruation:  Patient has menorrhagia since birth of her second child. She first reported this problem to me as a new patient in 11/2017. Requested records from OBGYN which showed negative endometrial biopsy in 2016 and ultrasound without fibroids. Requested patient to follow up in one month regarding this problem in 11/2017 but patient did not do so.    Patient complains of irregular menses. Patient's last menstrual period was 05/20/2018. Periods are irregular, lasting 4 days.Reports that she finished on Saturday, but her period restarted yesterday with clot and last night it was really heavy. Reports that she changed about 20 times. Patient reports passing multiple clots. Dysmenorrhea:moderate, occurring throughout cycle. Cyclic symptoms include lower abdominal and back pain.  Current contraception: none. History of infertility: no. History of abnormal Pap smear: no  She denies dizziness, SOB, syncope. Denies history of bleeding disorder, has not had any bleeding into joints or excessive bleeding with surgical procedures.  BP Readings from Last 3 Encounters:  05/26/18 91/62  12/12/17 96/66  08/01/17 128/78    No Known Allergies   Current Outpatient Medications:  .  ALPRAZolam (XANAX) 1 MG tablet, as needed., Disp: , Rfl: 0 .  citalopram (CELEXA) 40 MG tablet, Take 90 mg by mouth at bedtime. 1 &1/2 at bedtime , Disp: , Rfl:  .  ondansetron (ZOFRAN-ODT) 8 MG disintegrating tablet, Take 8 mg by mouth every 8 (eight) hours as needed for nausea or vomiting., Disp: , Rfl:  .  rizatriptan (MAXALT) 10 MG tablet, TAKE 1 TABLET BY MOUTH AT FIRST SIGN OF MIGRAINE. MAY REPEAT IN 2 HOURS AS NEEDED, Disp: 10 tablet, Rfl: 0 .  topiramate  (TOPAMAX) 200 MG tablet, Take 1 tablet (200 mg total) by mouth 1 dose over 46 hours., Disp: 100 tablet, Rfl: 3 .  traZODone (DESYREL) 100 MG tablet, Take 200 mg by mouth at bedtime., Disp: , Rfl:   Review of Systems  Social History   Tobacco Use  . Smoking status: Never Smoker  . Smokeless tobacco: Never Used  Substance Use Topics  . Alcohol use: No      Objective:   BP 91/62 (BP Location: Right Arm, Patient Position: Sitting, Cuff Size: Large)   Pulse 98   Temp 98 F (36.7 C) (Oral)   Resp 16   Wt 178 lb 3.2 oz (80.8 kg)   LMP 05/20/2018   BMI 29.65 kg/m  Vitals:   05/26/18 1005  BP: 91/62  Pulse: 98  Resp: 16  Temp: 98 F (36.7 C)  TempSrc: Oral  Weight: 178 lb 3.2 oz (80.8 kg)     Physical Exam Constitutional:      Appearance: Normal appearance.  Cardiovascular:     Rate and Rhythm: Normal rate and regular rhythm.     Heart sounds: Normal heart sounds.     Comments: Cap refill < 3 seconds.  Pulmonary:     Effort: Pulmonary effort is normal.     Breath sounds: Normal breath sounds.  Abdominal:     General: Abdomen is flat. There is no distension.     Palpations: Abdomen is soft.     Tenderness: There is no abdominal tenderness.  Skin:  General: Skin is warm and dry.     Coloration: Skin is not pale.  Neurological:     Mental Status: She is alert and oriented to person, place, and time. Mental status is at baseline.  Psychiatric:        Mood and Affect: Mood normal.        Behavior: Behavior normal.         Assessment & Plan    1. Menorrhagia with regular cycle  Persistent menorrhagia. Patient needs to be evaluated with imaging and possible endometrial biopsy, needs PAP updated. Due to imaging restrictions with coronavirus, I think the most stream lined evaluation would be to refer to OBGYN where they can do in-office imaging as well as PAP and biopsy.   - Ambulatory referral to Obstetrics / Gynecology - CBC with Differential - Comprehensive  Metabolic Panel (CMET)  The entirety of the information documented in the History of Present Illness, Review of Systems and Physical Exam were personally obtained by me. Portions of this information were initially documented by Hetty Ely, CMA and reviewed by me for thoroughness and accuracy.   Return if symptoms worsen or fail to improve.     Trey Sailors, PA-C  [facility]

## 2018-05-27 ENCOUNTER — Telehealth: Payer: Self-pay

## 2018-05-27 DIAGNOSIS — R42 Dizziness and giddiness: Secondary | ICD-10-CM | POA: Diagnosis not present

## 2018-05-27 DIAGNOSIS — Z6829 Body mass index (BMI) 29.0-29.9, adult: Secondary | ICD-10-CM | POA: Diagnosis not present

## 2018-05-27 DIAGNOSIS — N920 Excessive and frequent menstruation with regular cycle: Secondary | ICD-10-CM | POA: Diagnosis not present

## 2018-05-27 DIAGNOSIS — D649 Anemia, unspecified: Secondary | ICD-10-CM | POA: Diagnosis not present

## 2018-05-27 LAB — COMPREHENSIVE METABOLIC PANEL
ALT: 11 IU/L (ref 0–32)
AST: 15 IU/L (ref 0–40)
Albumin/Globulin Ratio: 2 (ref 1.2–2.2)
Albumin: 3.9 g/dL (ref 3.8–4.8)
Alkaline Phosphatase: 79 IU/L (ref 39–117)
BUN/Creatinine Ratio: 12 (ref 9–23)
BUN: 12 mg/dL (ref 6–24)
Bilirubin Total: 0.2 mg/dL (ref 0.0–1.2)
CO2: 19 mmol/L — ABNORMAL LOW (ref 20–29)
Calcium: 8.9 mg/dL (ref 8.7–10.2)
Chloride: 105 mmol/L (ref 96–106)
Creatinine, Ser: 1.01 mg/dL — ABNORMAL HIGH (ref 0.57–1.00)
GFR calc Af Amer: 78 mL/min/{1.73_m2} (ref >59)
GFR calc non Af Amer: 67 mL/min/{1.73_m2} (ref >59)
Globulin, Total: 2 g/dL (ref 1.5–4.5)
Glucose: 83 mg/dL (ref 65–99)
Potassium: 3.6 mmol/L (ref 3.5–5.2)
Sodium: 138 mmol/L (ref 134–144)
Total Protein: 5.9 g/dL — ABNORMAL LOW (ref 6.0–8.5)

## 2018-05-27 LAB — CBC WITH DIFFERENTIAL/PLATELET
Basophils Absolute: 0 10*3/uL (ref 0.0–0.2)
Basos: 1 %
EOS (ABSOLUTE): 0.1 10*3/uL (ref 0.0–0.4)
Eos: 2 %
Hematocrit: 32.4 % — ABNORMAL LOW (ref 34.0–46.6)
Hemoglobin: 10.8 g/dL — ABNORMAL LOW (ref 11.1–15.9)
Immature Grans (Abs): 0 10*3/uL (ref 0.0–0.1)
Immature Granulocytes: 0 %
Lymphocytes Absolute: 2.1 10*3/uL (ref 0.7–3.1)
Lymphs: 32 %
MCH: 30.1 pg (ref 26.6–33.0)
MCHC: 33.3 g/dL (ref 31.5–35.7)
MCV: 90 fL (ref 79–97)
Monocytes Absolute: 0.5 10*3/uL (ref 0.1–0.9)
Monocytes: 8 %
Neutrophils Absolute: 3.6 10*3/uL (ref 1.4–7.0)
Neutrophils: 57 %
Platelets: 193 10*3/uL (ref 150–450)
RBC: 3.59 x10E6/uL — ABNORMAL LOW (ref 3.77–5.28)
RDW: 12.4 % (ref 11.7–15.4)
WBC: 6.4 10*3/uL (ref 3.4–10.8)

## 2018-05-27 NOTE — Telephone Encounter (Signed)
Patient was notified of results. Expressed understanding.  

## 2018-05-27 NOTE — Telephone Encounter (Signed)
Left message for patient to call back  

## 2018-06-02 ENCOUNTER — Other Ambulatory Visit: Payer: Self-pay | Admitting: Physician Assistant

## 2018-06-02 ENCOUNTER — Telehealth: Payer: Self-pay | Admitting: Physician Assistant

## 2018-06-02 DIAGNOSIS — R102 Pelvic and perineal pain: Secondary | ICD-10-CM

## 2018-06-02 DIAGNOSIS — G43809 Other migraine, not intractable, without status migrainosus: Secondary | ICD-10-CM

## 2018-06-02 MED ORDER — MELOXICAM 7.5 MG PO TABS: ORAL_TABLET | ORAL | 0 refills | Status: DC

## 2018-06-02 NOTE — Telephone Encounter (Signed)
Patient advised as directed below.Per patient she saw Rocco Pauls on Thursday. Reports that they started her on a medication to stop the bleeding for 2 weeks  And she is to follow-up with them in a couple of weeks.

## 2018-06-02 NOTE — Telephone Encounter (Signed)
Pt is having back migraines from her menstrual cycles.  She needs something to take with the Maxalt 10 mg. She is in the bed with pain and bleeding.  She would like for someone to call her back  Call back [phone]  Thanks Barth Kirks

## 2018-06-02 NOTE — Telephone Encounter (Signed)
Please Review

## 2018-06-02 NOTE — Telephone Encounter (Signed)
Sent in meloxicam. Do not take with ibuprofen or naproxen or other NSAIDs. Did she get appointment with OBGYN?

## 2018-06-24 DIAGNOSIS — R1032 Left lower quadrant pain: Secondary | ICD-10-CM | POA: Diagnosis not present

## 2018-06-24 DIAGNOSIS — N920 Excessive and frequent menstruation with regular cycle: Secondary | ICD-10-CM | POA: Diagnosis not present

## 2018-06-25 DIAGNOSIS — C44519 Basal cell carcinoma of skin of other part of trunk: Secondary | ICD-10-CM | POA: Diagnosis not present

## 2018-06-25 DIAGNOSIS — D485 Neoplasm of uncertain behavior of skin: Secondary | ICD-10-CM | POA: Diagnosis not present

## 2018-06-25 DIAGNOSIS — L814 Other melanin hyperpigmentation: Secondary | ICD-10-CM | POA: Diagnosis not present

## 2018-06-25 DIAGNOSIS — L578 Other skin changes due to chronic exposure to nonionizing radiation: Secondary | ICD-10-CM | POA: Diagnosis not present

## 2018-06-25 DIAGNOSIS — L821 Other seborrheic keratosis: Secondary | ICD-10-CM | POA: Diagnosis not present

## 2018-07-07 ENCOUNTER — Other Ambulatory Visit: Payer: Self-pay | Admitting: Physician Assistant

## 2018-07-07 DIAGNOSIS — G43809 Other migraine, not intractable, without status migrainosus: Secondary | ICD-10-CM

## 2018-07-09 DIAGNOSIS — R1030 Lower abdominal pain, unspecified: Secondary | ICD-10-CM | POA: Diagnosis not present

## 2018-07-09 DIAGNOSIS — K5900 Constipation, unspecified: Secondary | ICD-10-CM | POA: Diagnosis not present

## 2018-07-09 NOTE — Telephone Encounter (Signed)
Please Review

## 2018-07-16 ENCOUNTER — Other Ambulatory Visit: Payer: Self-pay | Admitting: Obstetrics & Gynecology

## 2018-07-16 DIAGNOSIS — K5730 Diverticulosis of large intestine without perforation or abscess without bleeding: Secondary | ICD-10-CM | POA: Diagnosis not present

## 2018-07-16 DIAGNOSIS — R1030 Lower abdominal pain, unspecified: Secondary | ICD-10-CM | POA: Diagnosis not present

## 2018-07-16 DIAGNOSIS — K640 First degree hemorrhoids: Secondary | ICD-10-CM | POA: Diagnosis not present

## 2018-07-24 ENCOUNTER — Other Ambulatory Visit: Payer: Self-pay | Admitting: Family Medicine

## 2018-07-24 ENCOUNTER — Other Ambulatory Visit: Payer: Self-pay | Admitting: Obstetrics & Gynecology

## 2018-07-24 DIAGNOSIS — R1032 Left lower quadrant pain: Secondary | ICD-10-CM

## 2018-09-11 DIAGNOSIS — M25511 Pain in right shoulder: Secondary | ICD-10-CM | POA: Diagnosis not present

## 2018-09-21 ENCOUNTER — Other Ambulatory Visit: Payer: Self-pay | Admitting: Physician Assistant

## 2018-09-21 DIAGNOSIS — G43809 Other migraine, not intractable, without status migrainosus: Secondary | ICD-10-CM

## 2018-09-22 MED ORDER — RIZATRIPTAN BENZOATE 10 MG PO TABS: ORAL_TABLET | ORAL | 0 refills | Status: DC

## 2018-09-24 DIAGNOSIS — Z1283 Encounter for screening for malignant neoplasm of skin: Secondary | ICD-10-CM | POA: Diagnosis not present

## 2018-09-24 DIAGNOSIS — D2262 Melanocytic nevi of left upper limb, including shoulder: Secondary | ICD-10-CM | POA: Diagnosis not present

## 2018-09-24 DIAGNOSIS — D1800 Hemangioma unspecified site: Secondary | ICD-10-CM | POA: Diagnosis not present

## 2018-09-24 DIAGNOSIS — Z85828 Personal history of other malignant neoplasm of skin: Secondary | ICD-10-CM | POA: Diagnosis not present

## 2018-09-24 DIAGNOSIS — D485 Neoplasm of uncertain behavior of skin: Secondary | ICD-10-CM | POA: Diagnosis not present

## 2018-09-24 DIAGNOSIS — L578 Other skin changes due to chronic exposure to nonionizing radiation: Secondary | ICD-10-CM | POA: Diagnosis not present

## 2018-09-24 DIAGNOSIS — D225 Melanocytic nevi of trunk: Secondary | ICD-10-CM | POA: Diagnosis not present

## 2018-10-27 DIAGNOSIS — F410 Panic disorder [episodic paroxysmal anxiety] without agoraphobia: Secondary | ICD-10-CM | POA: Diagnosis not present

## 2018-10-27 DIAGNOSIS — F3342 Major depressive disorder, recurrent, in full remission: Secondary | ICD-10-CM | POA: Diagnosis not present

## 2018-10-27 DIAGNOSIS — F900 Attention-deficit hyperactivity disorder, predominantly inattentive type: Secondary | ICD-10-CM | POA: Diagnosis not present

## 2018-11-12 ENCOUNTER — Other Ambulatory Visit: Payer: Self-pay | Admitting: Family Medicine

## 2018-11-12 ENCOUNTER — Encounter: Payer: Self-pay | Admitting: Physician Assistant

## 2018-11-12 DIAGNOSIS — G43809 Other migraine, not intractable, without status migrainosus: Secondary | ICD-10-CM

## 2018-11-12 MED ORDER — RIZATRIPTAN BENZOATE 10 MG PO TABS: ORAL_TABLET | ORAL | 0 refills | Status: DC

## 2018-11-13 ENCOUNTER — Encounter: Payer: Self-pay | Admitting: Physician Assistant

## 2018-11-13 NOTE — Telephone Encounter (Signed)
Please schedule her a visit for migraines. Scroll down to see text after message.

## 2018-11-13 NOTE — Telephone Encounter (Signed)
Please schedule visit for worsening migraines.

## 2018-12-01 DIAGNOSIS — D225 Melanocytic nevi of trunk: Secondary | ICD-10-CM | POA: Diagnosis not present

## 2018-12-01 DIAGNOSIS — L988 Other specified disorders of the skin and subcutaneous tissue: Secondary | ICD-10-CM | POA: Diagnosis not present

## 2018-12-08 DIAGNOSIS — Z4802 Encounter for removal of sutures: Secondary | ICD-10-CM | POA: Diagnosis not present

## 2018-12-08 DIAGNOSIS — D225 Melanocytic nevi of trunk: Secondary | ICD-10-CM | POA: Diagnosis not present

## 2018-12-09 DIAGNOSIS — M7641 Tibial collateral bursitis [Pellegrini-Stieda], right leg: Secondary | ICD-10-CM | POA: Diagnosis not present

## 2018-12-17 DIAGNOSIS — M25511 Pain in right shoulder: Secondary | ICD-10-CM | POA: Diagnosis not present

## 2018-12-17 DIAGNOSIS — M25611 Stiffness of right shoulder, not elsewhere classified: Secondary | ICD-10-CM | POA: Diagnosis not present

## 2018-12-31 DIAGNOSIS — M25511 Pain in right shoulder: Secondary | ICD-10-CM | POA: Diagnosis not present

## 2018-12-31 DIAGNOSIS — M25611 Stiffness of right shoulder, not elsewhere classified: Secondary | ICD-10-CM | POA: Diagnosis not present

## 2019-01-05 DIAGNOSIS — M25511 Pain in right shoulder: Secondary | ICD-10-CM | POA: Diagnosis not present

## 2019-01-05 DIAGNOSIS — M25611 Stiffness of right shoulder, not elsewhere classified: Secondary | ICD-10-CM | POA: Diagnosis not present

## 2019-01-12 DIAGNOSIS — M25611 Stiffness of right shoulder, not elsewhere classified: Secondary | ICD-10-CM | POA: Diagnosis not present

## 2019-01-12 DIAGNOSIS — M25511 Pain in right shoulder: Secondary | ICD-10-CM | POA: Diagnosis not present

## 2019-01-18 DIAGNOSIS — M7501 Adhesive capsulitis of right shoulder: Secondary | ICD-10-CM | POA: Diagnosis not present

## 2019-01-18 DIAGNOSIS — M7541 Impingement syndrome of right shoulder: Secondary | ICD-10-CM | POA: Diagnosis not present

## 2019-01-18 DIAGNOSIS — M25611 Stiffness of right shoulder, not elsewhere classified: Secondary | ICD-10-CM | POA: Diagnosis not present

## 2019-01-18 DIAGNOSIS — M25511 Pain in right shoulder: Secondary | ICD-10-CM | POA: Diagnosis not present

## 2019-01-27 DIAGNOSIS — M25511 Pain in right shoulder: Secondary | ICD-10-CM | POA: Diagnosis not present

## 2019-02-03 ENCOUNTER — Other Ambulatory Visit: Payer: Self-pay | Admitting: Physician Assistant

## 2019-02-03 ENCOUNTER — Other Ambulatory Visit: Payer: Self-pay

## 2019-02-03 DIAGNOSIS — G43809 Other migraine, not intractable, without status migrainosus: Secondary | ICD-10-CM

## 2019-02-03 DIAGNOSIS — Z20822 Contact with and (suspected) exposure to covid-19: Secondary | ICD-10-CM

## 2019-02-04 ENCOUNTER — Other Ambulatory Visit: Payer: Self-pay | Admitting: Physician Assistant

## 2019-02-04 DIAGNOSIS — G43809 Other migraine, not intractable, without status migrainosus: Secondary | ICD-10-CM

## 2019-02-04 MED ORDER — RIZATRIPTAN BENZOATE 10 MG PO TABS: ORAL_TABLET | ORAL | 0 refills | Status: DC

## 2019-02-05 LAB — NOVEL CORONAVIRUS, NAA: SARS-CoV-2, NAA: NOT DETECTED

## 2019-02-05 MED ORDER — RIZATRIPTAN BENZOATE 10 MG PO TABS: ORAL_TABLET | ORAL | 0 refills | Status: DC

## 2019-03-19 DIAGNOSIS — Z23 Encounter for immunization: Secondary | ICD-10-CM | POA: Diagnosis not present

## 2019-03-20 ENCOUNTER — Other Ambulatory Visit: Payer: Self-pay | Admitting: Physician Assistant

## 2019-03-20 DIAGNOSIS — G43809 Other migraine, not intractable, without status migrainosus: Secondary | ICD-10-CM

## 2019-03-22 MED ORDER — RIZATRIPTAN BENZOATE 10 MG PO TABS: ORAL_TABLET | ORAL | 0 refills | Status: DC

## 2019-04-27 ENCOUNTER — Emergency Department
Admission: EM | Admit: 2019-04-27 | Discharge: 2019-04-27 | Disposition: A | Discharge status: Home / Self Care | Payer: BC Managed Care – PPO | Attending: Emergency Medicine | Admitting: Emergency Medicine

## 2019-04-27 ENCOUNTER — Other Ambulatory Visit: Payer: Self-pay

## 2019-04-27 ENCOUNTER — Emergency Department: Payer: BC Managed Care – PPO

## 2019-04-27 DIAGNOSIS — Z79899 Other long term (current) drug therapy: Secondary | ICD-10-CM | POA: Diagnosis not present

## 2019-04-27 DIAGNOSIS — R1013 Epigastric pain: Secondary | ICD-10-CM

## 2019-04-27 LAB — URINALYSIS, COMPLETE (UACMP) WITH MICROSCOPIC
Bacteria, UA: NONE SEEN
Bilirubin Urine: NEGATIVE
Glucose, UA: NEGATIVE mg/dL
Hgb urine dipstick: NEGATIVE
Ketones, ur: NEGATIVE mg/dL
Nitrite: NEGATIVE
Protein, ur: NEGATIVE mg/dL
Specific Gravity, Urine: 1.015 (ref 1.005–1.030)
pH: 6 (ref 5.0–8.0)

## 2019-04-27 LAB — COMPREHENSIVE METABOLIC PANEL
ALT: 16 U/L (ref 0–44)
AST: 17 U/L (ref 15–41)
Albumin: 4.4 g/dL (ref 3.5–5.0)
Alkaline Phosphatase: 76 U/L (ref 38–126)
Anion gap: 4 — ABNORMAL LOW (ref 5–15)
BUN: 18 mg/dL (ref 6–20)
CO2: 26 mmol/L (ref 22–32)
Calcium: 9 mg/dL (ref 8.9–10.3)
Chloride: 107 mmol/L (ref 98–111)
Creatinine, Ser: 1.12 mg/dL — ABNORMAL HIGH (ref 0.44–1.00)
GFR calc Af Amer: >60 mL/min (ref >60)
GFR calc non Af Amer: 59 mL/min — ABNORMAL LOW (ref >60)
Glucose, Bld: 103 mg/dL — ABNORMAL HIGH (ref 70–99)
Potassium: 3.8 mmol/L (ref 3.5–5.1)
Sodium: 137 mmol/L (ref 135–145)
Total Bilirubin: 0.5 mg/dL (ref 0.3–1.2)
Total Protein: 7.4 g/dL (ref 6.5–8.1)

## 2019-04-27 LAB — POCT PREGNANCY, URINE: Preg Test, Ur: NEGATIVE

## 2019-04-27 LAB — CBC
HCT: 39.9 % (ref 36.0–46.0)
Hemoglobin: 13.3 g/dL (ref 12.0–15.0)
MCH: 29.8 pg (ref 26.0–34.0)
MCHC: 33.3 g/dL (ref 30.0–36.0)
MCV: 89.5 fL (ref 80.0–100.0)
Platelets: 203 10*3/uL (ref 150–400)
RBC: 4.46 MIL/uL (ref 3.87–5.11)
RDW: 11.8 % (ref 11.5–15.5)
WBC: 7 10*3/uL (ref 4.0–10.5)
nRBC: 0 % (ref 0.0–0.2)

## 2019-04-27 LAB — LIPASE, BLOOD: Lipase: 43 U/L (ref 11–51)

## 2019-04-27 MED ORDER — ALUM & MAG HYDROXIDE-SIMETH 200-200-20 MG/5ML PO SUSP
15.0000 mL | Freq: Once | ORAL | Status: AC
  Administered 2019-04-27: 21:00:00 15 mL via ORAL
  Filled 2019-04-27: qty 30

## 2019-04-27 MED ORDER — METOCLOPRAMIDE HCL 5 MG/ML IJ SOLN
10.0000 mg | Freq: Once | INTRAMUSCULAR | Status: AC
  Administered 2019-04-27: 21:00:00 10 mg via INTRAVENOUS
  Filled 2019-04-27: qty 2

## 2019-04-27 MED ORDER — SUCRALFATE 1 G PO TABS: 1.0000 g | ORAL_TABLET | Freq: Four times a day (QID) | ORAL | 0 refills | Status: DC | PRN

## 2019-04-27 MED ORDER — FAMOTIDINE 20 MG PO TABS
20.0000 mg | ORAL_TABLET | Freq: Once | ORAL | Status: AC
  Administered 2019-04-27: 20 mg via ORAL
  Filled 2019-04-27: qty 1

## 2019-04-27 MED ORDER — FAMOTIDINE 20 MG PO TABS: 20.0000 mg | ORAL_TABLET | Freq: Two times a day (BID) | ORAL | 0 refills | Status: DC

## 2019-04-27 NOTE — ED Notes (Signed)
Pt states having severe abdominal pain that made her very light headed today and that she felt she was about to pass out. Pt denies fevers/vomiting/diarrhea.

## 2019-04-27 NOTE — Discharge Instructions (Signed)
Take the Pepcid twice daily over the next few weeks.  You can take the Carafate in addition to this if you have more severe upper belly pain or reflux.  You may take the Zofran you have at home if needed for nausea.  Follow-up with Dr. Daleen Squibb or another GI doctor of your choice as planned.  Return to the ER for new, worsening, or persistent severe abdominal pain, vomiting, fever, weakness, or any other new or worsening symptoms that concern you.

## 2019-04-27 NOTE — ED Triage Notes (Signed)
Pt arrived via pOV with reports of stomach pain starting on Saturday, pt states she has a hx of IBS, Pt c/o upper abd pain that she describes as sharp, worse today, pt c/o of burning pain as well.   Pt states she is unable to eat due to the pain, pt states over the past 30 minutes she states the pain has become a stabbing pain.  Pt states she is taking stool softeners and took MOM as well with no relief.

## 2019-04-28 NOTE — ED Provider Notes (Signed)
[facility] Emergency Department Provider Note ____________________________________________   First MD Initiated Contact with Patient 04/27/19 2044     (approximate)  I have reviewed the triage vital signs and the nursing notes.   HISTORY  Chief Complaint No chief complaint on file.    HPI Charlene Freeman is a 49 y.o. female with PMH as noted below who presents with epigastric abdominal pain over the last several days, gradual onset, intermittent, and sometimes with burning pain radiating up into her chest.  She reports some associated nausea but no vomiting.  She states that she has had decreased appetite.  She denies any diarrhea or fever.  She states that she previously had lower abdominal pain with IBS, but this feels different.  She is not on any antacids although did take some Tums and Pepto-Bismol without any significant relief.  Past Medical History:  Diagnosis Date  . Anxiety   . Depression   . Headache(784.0)   . Migraine   . Migraine     Patient Active Problem List   Diagnosis Date Noted  . Dysfunctional uterine bleeding 04/23/2010  . CHEST WALL PAIN, ACUTE 01/11/2008  . Depression 03/26/2007  . Migraine 11/19/2006    Past Surgical History:  Procedure Laterality Date  . APPENDECTOMY      Prior to Admission medications   Medication Sig Start Date End Date Taking? Authorizing Provider  ALPRAZolam Prudy Feeler) 1 MG tablet as needed. 12/07/17   [provider]  citalopram (CELEXA) 40 MG tablet Take 90 mg by mouth at bedtime. 1 &1/2 at bedtime     [provider]  famotidine (PEPCID) 20 MG tablet Take 1 tablet (20 mg total) by mouth 2 (two) times daily for 15 days. 04/27/19 05/12/19  Dionne Bucy, MD  meloxicam (MOBIC) 7.5 MG tablet Take 1-2 tablets daily as needed. 06/02/18   Trey Sailors, PA-C  ondansetron (ZOFRAN-ODT) 8 MG disintegrating tablet Take 8 mg by mouth every 8 (eight) hours as needed for nausea or  vomiting.    [provider]  rizatriptan (MAXALT) 10 MG tablet TAKE 1 TABLET BY MOUTH AT FIRST SIGN OF MIGRAINE AND MAY REPEAT IN 2 HOURS AS NEEDED 03/22/19   Trey Sailors, PA-C  sucralfate (CARAFATE) 1 g tablet Take 1 tablet (1 g total) by mouth 4 (four) times daily as needed. 04/27/19 04/26/20  Dionne Bucy, MD  topiramate (TOPAMAX) 200 MG tablet Take 1 tablet (200 mg total) by mouth 1 dose over 46 hours. 04/23/10   Roderick Pee, MD  traZODone (DESYREL) 100 MG tablet Take 200 mg by mouth at bedtime.    [provider]    Allergies Patient has no known allergies.  Family History  Problem Relation Age of Onset  . Migraines Other   . Glaucoma Other     Social History Social History   Tobacco Use  . Smoking status: Never Smoker  . Smokeless tobacco: Never Used  Substance Use Topics  . Alcohol use: No  . Drug use: No    Review of Systems  Constitutional: No fever. Eyes: No redness. ENT: No sore throat. Cardiovascular: Denies chest pain. Respiratory: Denies shortness of breath. Gastrointestinal: Positive for nausea. Genitourinary: Negative for flank pain.  Musculoskeletal: Negative for back pain. Skin: Negative for rash. Neurological: Negative for headache.   ____________________________________________   PHYSICAL EXAM:  VITAL SIGNS: ED Triage Vitals [04/27/19 2007]  Enc Vitals Group     BP 139/64     Pulse  Rate 99     Resp 18     Temp 97.6 F (36.4 C)     Temp Source Oral     SpO2 98 %     Weight 185 lb (83.9 kg)     Height 5\' 5"  (1.651 m)     Head Circumference      Peak Flow      Pain Score 10     Pain Loc      Pain Edu?      Excl. in [place]?     Constitutional: Alert and oriented. Well appearing and in no acute distress. Eyes: Conjunctivae are normal.  Head: Atraumatic. Nose: No congestion/rhinnorhea. Mouth/Throat: Mucous membranes are moist.   Neck: Normal range of motion.  Cardiovascular: Normal rate, regular rhythm.  Good peripheral circulation. Respiratory: Normal respiratory effort.  No retractions.  Gastrointestinal: Soft with mild epigastric tenderness.  No distention.  Genitourinary: No flank tenderness. Musculoskeletal: Extremities warm and well perfused.  Neurologic:  Normal speech and language. No gross focal neurologic deficits are appreciated.  Skin:  Skin is warm and dry. No rash noted. Psychiatric: Mood and affect are normal. Speech and behavior are normal.  ____________________________________________   LABS (all labs ordered are listed, but only abnormal results are displayed)  Labs Reviewed  COMPREHENSIVE METABOLIC PANEL - Abnormal; Notable for the following components:      Result Value   Glucose, Bld 103 (*)    Creatinine, Ser 1.12 (*)    GFR calc non Af Amer 59 (*)    Anion gap 4 (*)    All other components within normal limits  URINALYSIS, COMPLETE (UACMP) WITH MICROSCOPIC - Abnormal; Notable for the following components:   Color, Urine YELLOW (*)    APPearance HAZY (*)    Leukocytes,Ua SMALL (*)    All other components within normal limits  LIPASE, BLOOD  CBC  POC URINE PREG, ED  POCT PREGNANCY, URINE   ____________________________________________  EKG  ED ECG REPORT I, Arta Silence, the attending physician, personally viewed and interpreted this ECG.  Date: 04/28/2019 EKG Time: 2014 Rate: 81 Rhythm: normal sinus rhythm QRS Axis: normal Intervals: normal ST/T Wave abnormalities: normal Narrative Interpretation: no evidence of acute ischemia  ____________________________________________  RADIOLOGY  US abdomen RUQ: No acute abnormality  ____________________________________________   PROCEDURES  Procedure(s) performed: No  Procedures  Critical Care performed: No ____________________________________________   INITIAL IMPRESSION / ASSESSMENT AND PLAN / ED COURSE  Pertinent labs & imaging results that were available during my care of the  patient were reviewed by me and considered in my medical decision making (see chart for details).  49 year old female with PMH as noted above presents with epigastric abdominal pain sometimes rating up into her chest associated nausea and decreased appetite.  On exam she is overall very well-appearing and her vital signs are normal.  The abdomen is soft with mild epigastric discomfort but no focal tenderness or peritoneal signs.  Initial lab work-up was obtained from triage and was within normal limits.  Differential includes gastritis, PUD, GERD, or less likely hepatobiliary cause.  I obtained a right upper quadrant ultrasound which was negative.  The patient had improvement in her pain and nausea with Reglan, Maalox, and Pepcid.  Overall I suspect most likely gastritis/GERD.  At this time, the patient is stable for discharge home.  I counseled her on the results of the work-up.  She already has plans to follow-up with GI in the next few weeks.  I recommended  that she start on Pepcid twice daily, and have given Carafate for additional symptom relief.  The patient also has Zofran at home.  I gave her thorough return precautions and she expressed understanding.  ____________________________________________   FINAL CLINICAL IMPRESSION(S) / ED DIAGNOSES  Final diagnoses:  Epigastric pain      NEW MEDICATIONS STARTED DURING THIS VISIT:  Discharge Medication List as of 04/27/2019 11:56 PM    START taking these medications   Details  famotidine (PEPCID) 20 MG tablet Take 1 tablet (20 mg total) by mouth 2 (two) times daily for 15 days., Starting Tue 04/27/2019, Until Wed 05/12/2019, Normal    sucralfate (CARAFATE) 1 g tablet Take 1 tablet (1 g total) by mouth 4 (four) times daily as needed., Starting Tue 04/27/2019, Until Wed 04/26/2020, Normal         Note:  This document was prepared using Dragon voice recognition software and may include unintentional dictation errors.    Dionne Bucy, MD 04/28/19 [phone]

## 2019-05-19 DIAGNOSIS — F410 Panic disorder [episodic paroxysmal anxiety] without agoraphobia: Secondary | ICD-10-CM | POA: Diagnosis not present

## 2019-05-19 DIAGNOSIS — F3342 Major depressive disorder, recurrent, in full remission: Secondary | ICD-10-CM | POA: Diagnosis not present

## 2019-06-20 ENCOUNTER — Other Ambulatory Visit: Payer: Self-pay | Admitting: Physician Assistant

## 2019-06-20 DIAGNOSIS — G43809 Other migraine, not intractable, without status migrainosus: Secondary | ICD-10-CM

## 2019-06-22 MED ORDER — RIZATRIPTAN BENZOATE 10 MG PO TABS: ORAL_TABLET | ORAL | 0 refills | Status: DC

## 2019-06-30 ENCOUNTER — Other Ambulatory Visit: Payer: Self-pay

## 2019-06-30 ENCOUNTER — Ambulatory Visit (INDEPENDENT_AMBULATORY_CARE_PROVIDER_SITE_OTHER): Payer: BC Managed Care – PPO | Admitting: Physician Assistant

## 2019-06-30 VITALS — BP 110/58 | HR 102 | Temp 97.1°F | Wt 185.0 lb

## 2019-06-30 DIAGNOSIS — G43809 Other migraine, not intractable, without status migrainosus: Secondary | ICD-10-CM | POA: Diagnosis not present

## 2019-06-30 DIAGNOSIS — Z03818 Encounter for observation for suspected exposure to other biological agents ruled out: Secondary | ICD-10-CM | POA: Diagnosis not present

## 2019-06-30 DIAGNOSIS — W57XXXA Bitten or stung by nonvenomous insect and other nonvenomous arthropods, initial encounter: Secondary | ICD-10-CM | POA: Diagnosis not present

## 2019-06-30 DIAGNOSIS — K219 Gastro-esophageal reflux disease without esophagitis: Secondary | ICD-10-CM | POA: Diagnosis not present

## 2019-06-30 MED ORDER — FAMOTIDINE 20 MG PO TABS: 20.0000 mg | ORAL_TABLET | Freq: Two times a day (BID) | ORAL | 0 refills | Status: DC

## 2019-06-30 MED ORDER — DOXYCYCLINE HYCLATE 100 MG PO TABS: 100.0000 mg | ORAL_TABLET | Freq: Two times a day (BID) | ORAL | 0 refills | Status: DC

## 2019-06-30 MED ORDER — KETOROLAC TROMETHAMINE 10 MG PO TABS: 10.0000 mg | ORAL_TABLET | Freq: Four times a day (QID) | ORAL | 0 refills | Status: DC | PRN

## 2019-06-30 MED ORDER — SUCRALFATE 1 G PO TABS: 1.0000 g | ORAL_TABLET | Freq: Four times a day (QID) | ORAL | 0 refills | Status: DC | PRN

## 2019-06-30 NOTE — Progress Notes (Signed)
Acute Office Visit   I,Elena D DeSanto,acting as a scribe for Trey Sailors, PA-C.,have documented all relevant documentation on the behalf of Trey Sailors, PA-C,as directed by  Trey Sailors, PA-C while in the presence of Trey Sailors, PA-C.   Subjective:    Patient ID: Charlene Freeman, female    DOB: 04-27-1970, 49 y.o.   MRN: [id number]  Chief Complaint  Patient presents with  . Insect Bite    HPI Patient is in today for tick bite. Date of bite: 06/26/2019 Site of bite: Left lower abdomen Appearance of bite: red, quarter sized, today red and dime size Removal of tick:: whole tick appeared to be removed  Patient complains of aching and headaches.  She is having to take increased amount of her migraine medications and Ibuprofen.  Reports her neck being stiff. She reports feeling very unwell.   Reports temperature of 99.3 at home.   Past Medical History:  Diagnosis Date  . Anxiety   . Depression   . Headache(784.0)   . Migraine   . Migraine     Past Surgical History:  Procedure Laterality Date  . APPENDECTOMY      Family History  Problem Relation Age of Onset  . Migraines Other   . Glaucoma Other     Social History   Socioeconomic History  . Marital status: Married    Spouse name: Not on file  . Number of children: Not on file  . Years of education: Not on file  . Highest education level: Not on file  Occupational History  . Not on file  Tobacco Use  . Smoking status: Never Smoker  . Smokeless tobacco: Never Used  Substance and Sexual Activity  . Alcohol use: No  . Drug use: No  . Sexual activity: Not on file  Other Topics Concern  . Not on file  Social History Narrative   ** Merged History Encounter **       Social Determinants of Health   Financial Resource Strain:   . Difficulty of Paying Living Expenses:   Food Insecurity:   . Worried About Programme researcher, broadcasting/film/video in the Last Year:   . Barista in the Last Year:     Transportation Needs:   . Freight forwarder (Medical):   Marland Kitchen Lack of Transportation (Non-Medical):   Physical Activity:   . Days of Exercise per Week:   . Minutes of Exercise per Session:   Stress:   . Feeling of Stress :   Social Connections:   . Frequency of Communication with Friends and Family:   . Frequency of Social Gatherings with Friends and Family:   . Attends Religious Services:   . Active Member of Clubs or Organizations:   . Attends Banker Meetings:   Marland Kitchen Marital Status:   Intimate Partner Violence:   . Fear of Current or Ex-Partner:   . Emotionally Abused:   Marland Kitchen Physically Abused:   . Sexually Abused:     Outpatient Medications Prior to Visit  Medication Sig Dispense Refill  . ALPRAZolam (XANAX) 1 MG tablet as needed.  0  . DULoxetine (CYMBALTA) 30 MG capsule Take 30 mg by mouth 2 (two) times daily.    . rizatriptan (MAXALT) 10 MG tablet TAKE 1 TABLET BY MOUTH AT FIRST SIGN OF MIGRAINE AND MAY REPEAT IN 2 HOURS AS NEEDED 10 tablet 0  . topiramate (TOPAMAX) 200 MG tablet Take 1 tablet (200 mg total)  by mouth 1 dose over 46 hours. 100 tablet 3  . traZODone (DESYREL) 100 MG tablet Take 200 mg by mouth at bedtime.    . citalopram (CELEXA) 40 MG tablet Take 90 mg by mouth at bedtime. 1 &1/2 at bedtime     . meloxicam (MOBIC) 7.5 MG tablet Take 1-2 tablets daily as needed. (Patient not taking: Reported on 06/30/2019) 30 tablet 0  . ondansetron (ZOFRAN-ODT) 8 MG disintegrating tablet Take 8 mg by mouth every 8 (eight) hours as needed for nausea or vomiting.    . famotidine (PEPCID) 20 MG tablet Take 1 tablet (20 mg total) by mouth 2 (two) times daily for 15 days. 30 tablet 0  . sucralfate (CARAFATE) 1 g tablet Take 1 tablet (1 g total) by mouth 4 (four) times daily as needed. (Patient not taking: Reported on 06/30/2019) 30 tablet 0   No facility-administered medications prior to visit.    No Known Allergies  Review of Systems  Constitutional: Positive for  chills, fatigue and fever.  HENT: Negative for congestion, ear pain, sinus pain, sneezing and sore throat.   Respiratory: Positive for cough. Negative for chest tightness, shortness of breath and wheezing.   Cardiovascular: Negative for chest pain.  Gastrointestinal: Negative for abdominal pain.  Musculoskeletal: Positive for myalgias. Negative for arthralgias.  Neurological: Positive for headaches. Negative for dizziness.       Objective:    Physical Exam Constitutional:      Appearance: Normal appearance.  Skin:    General: Skin is warm and dry.     Comments: Erythematous lesion left lower abdomen  Neurological:     Mental Status: She is alert.  Psychiatric:        Mood and Affect: Mood normal.        Behavior: Behavior normal.        Thought Content: Thought content normal.        Judgment: Judgment normal.     BP (!) 110/58 (BP Location: Left Arm, Patient Position: Sitting, Cuff Size: Normal)   Pulse (!) 102   Temp (!) 97.1 F (36.2 C) (Skin)   Wt 185 lb (83.9 kg)   SpO2 99%   BMI 30.79 kg/m  Wt Readings from Last 3 Encounters:  06/30/19 185 lb (83.9 kg)  04/27/19 185 lb (83.9 kg)  05/26/18 178 lb 3.2 oz (80.8 kg)   Health Maintenance Due  Topic Date Due  . HIV Screening  Never done  . TETANUS/TDAP  Never done  . PAP SMEAR-Modifier  Never done    There are no preventive care reminders to display for this patient.    Lab Results  Component Value Date   WBC 7.0 04/27/2019   HGB 13.3 04/27/2019   HCT 39.9 04/27/2019   MCV 89.5 04/27/2019   PLT 203 04/27/2019   Lab Results  Component Value Date   NA 137 04/27/2019   K 3.8 04/27/2019   CO2 26 04/27/2019   GLUCOSE 103 (H) 04/27/2019   BUN 18 04/27/2019   CREATININE 1.12 (H) 04/27/2019   BILITOT 0.5 04/27/2019   ALKPHOS 76 04/27/2019   AST 17 04/27/2019   ALT 16 04/27/2019   PROT 7.4 04/27/2019   ALBUMIN 4.4 04/27/2019   CALCIUM 9.0 04/27/2019   ANIONGAP 4 (L) 04/27/2019   Lab Results    Component Value Date   CHOL (H) 01/12/2008    224        ATP III CLASSIFICATION:  <200     mg/dL  Desirable  200-239  mg/dL   Borderline High  >=240    mg/dL   High   Lab Results  Component Value Date   HDL 55 01/12/2008   Lab Results  Component Value Date   LDLCALC (H) 01/12/2008    156        Total Cholesterol/HDL:CHD Risk Coronary Heart Disease Risk Table                     Men   Women  1/2 Average Risk   3.4   3.3   Lab Results  Component Value Date   TRIG 63 01/12/2008   Lab Results  Component Value Date   CHOLHDL 4.1 01/12/2008   Lab Results  Component Value Date   HGBA1C  01/12/2008    5.3 (NOTE)   The ADA recommends the following therapeutic goal for glycemic   control related to Hgb A1C measurement:   Goal of Therapy:   < 7.0% Hgb A1C   Reference: American Diabetes Association: Clinical Practice   Recommendations 2008, Diabetes Care,  2008, 31:(Suppl 1).       Assessment & Plan:   Problem List Items Addressed This Visit      Cardiovascular and Mediastinum   Migraine   Relevant Medications   DULoxetine (CYMBALTA) 30 MG capsule   ketorolac (TORADOL) 10 MG tablet   sucralfate (CARAFATE) 1 g tablet    Other Visit Diagnoses    Tick bite, initial encounter    -  Primary   Relevant Medications   doxycycline (VIBRA-TABS) 100 MG tablet   Gastroesophageal reflux disease, unspecified whether esophagitis present       Relevant Medications   famotidine (PEPCID) 20 MG tablet   sucralfate (CARAFATE) 1 g tablet     1. Tick bite, initial encounter Will cover with antibiotic for the possibility of RMSF.  Advised to be Covid tested due to associated symptoms.  - doxycycline (VIBRA-TABS) 100 MG tablet; Take 1 tablet (100 mg total) by mouth 2 (two) times daily.  Dispense: 20 tablet; Refill: 0  2. Other migraine without status migrainosus, not intractable  Will send in Toradol 10 mg # 20.  Advised not to take any NSAIDS  3. Gastroesophageal reflux disease,  unspecified whether esophagitis present  - famotidine (PEPCID) 20 MG tablet; Take 1 tablet (20 mg total) by mouth 2 (two) times daily for 15 days.  Dispense: 30 tablet; Refill: 0 - sucralfate (CARAFATE) 1 g tablet; Take 1 tablet (1 g total) by mouth 4 (four) times daily as needed.  Dispense: 30 tablet; Refill: 0  I, Trinna Post, PA-C, have reviewed all documentation for this visit. The documentation on 06/30/19 for the exam, diagnosis, procedures, and orders are all accurate and complete.      Trinna Post, PA-C

## 2019-07-12 DIAGNOSIS — M7501 Adhesive capsulitis of right shoulder: Secondary | ICD-10-CM | POA: Diagnosis not present

## 2019-07-25 ENCOUNTER — Other Ambulatory Visit: Payer: Self-pay | Admitting: Physician Assistant

## 2019-07-25 DIAGNOSIS — G43809 Other migraine, not intractable, without status migrainosus: Secondary | ICD-10-CM

## 2019-07-27 MED ORDER — RIZATRIPTAN BENZOATE 10 MG PO TABS: ORAL_TABLET | ORAL | 0 refills | Status: DC

## 2019-10-02 ENCOUNTER — Other Ambulatory Visit: Payer: Self-pay | Admitting: Physician Assistant

## 2019-10-02 DIAGNOSIS — G43809 Other migraine, not intractable, without status migrainosus: Secondary | ICD-10-CM

## 2019-10-04 LAB — HM PAP SMEAR: HM Pap smear: NEGATIVE

## 2019-10-04 LAB — RESULTS CONSOLE HPV: CHL HPV: NEGATIVE

## 2019-10-05 MED ORDER — RIZATRIPTAN BENZOATE 10 MG PO TABS: ORAL_TABLET | ORAL | 0 refills | Status: DC

## 2019-10-05 NOTE — Telephone Encounter (Signed)
L.O.V. was on 06/30/2019 and medication send into pharmacy.

## 2019-10-12 DIAGNOSIS — M940 Chondrocostal junction syndrome [Tietze]: Secondary | ICD-10-CM | POA: Diagnosis not present

## 2019-10-12 DIAGNOSIS — N644 Mastodynia: Secondary | ICD-10-CM | POA: Diagnosis not present

## 2019-10-21 ENCOUNTER — Other Ambulatory Visit: Payer: Self-pay | Admitting: Obstetrics & Gynecology

## 2019-10-21 DIAGNOSIS — R1032 Left lower quadrant pain: Secondary | ICD-10-CM

## 2019-10-29 ENCOUNTER — Other Ambulatory Visit: Payer: BC Managed Care – PPO

## 2019-11-08 DIAGNOSIS — Z01419 Encounter for gynecological examination (general) (routine) without abnormal findings: Secondary | ICD-10-CM | POA: Diagnosis not present

## 2019-11-08 DIAGNOSIS — R102 Pelvic and perineal pain: Secondary | ICD-10-CM | POA: Diagnosis not present

## 2019-11-08 DIAGNOSIS — N809 Endometriosis, unspecified: Secondary | ICD-10-CM | POA: Diagnosis not present

## 2019-11-08 DIAGNOSIS — N941 Unspecified dyspareunia: Secondary | ICD-10-CM | POA: Diagnosis not present

## 2019-11-08 DIAGNOSIS — Z1231 Encounter for screening mammogram for malignant neoplasm of breast: Secondary | ICD-10-CM | POA: Diagnosis not present

## 2019-11-08 DIAGNOSIS — Z1151 Encounter for screening for human papillomavirus (HPV): Secondary | ICD-10-CM | POA: Diagnosis not present

## 2019-11-08 DIAGNOSIS — Z01411 Encounter for gynecological examination (general) (routine) with abnormal findings: Secondary | ICD-10-CM | POA: Diagnosis not present

## 2019-11-08 DIAGNOSIS — Z6831 Body mass index (BMI) 31.0-31.9, adult: Secondary | ICD-10-CM | POA: Diagnosis not present

## 2019-11-08 LAB — HM MAMMOGRAPHY

## 2019-11-16 ENCOUNTER — Other Ambulatory Visit: Payer: Self-pay | Admitting: Physician Assistant

## 2019-11-16 DIAGNOSIS — G43809 Other migraine, not intractable, without status migrainosus: Secondary | ICD-10-CM

## 2019-11-18 MED ORDER — RIZATRIPTAN BENZOATE 10 MG PO TABS: ORAL_TABLET | ORAL | 0 refills | Status: DC

## 2019-12-01 DIAGNOSIS — R1032 Left lower quadrant pain: Secondary | ICD-10-CM | POA: Diagnosis not present

## 2019-12-01 DIAGNOSIS — N941 Unspecified dyspareunia: Secondary | ICD-10-CM | POA: Diagnosis not present

## 2019-12-17 ENCOUNTER — Encounter: Payer: Self-pay | Admitting: Physician Assistant

## 2019-12-17 ENCOUNTER — Ambulatory Visit (INDEPENDENT_AMBULATORY_CARE_PROVIDER_SITE_OTHER): Payer: BC Managed Care – PPO | Admitting: Physician Assistant

## 2019-12-17 ENCOUNTER — Other Ambulatory Visit: Payer: Self-pay

## 2019-12-17 VITALS — BP 115/67 | HR 100 | Temp 98.4°F | Resp 16 | Ht 65.0 in | Wt 186.0 lb

## 2019-12-17 DIAGNOSIS — F329 Major depressive disorder, single episode, unspecified: Secondary | ICD-10-CM | POA: Diagnosis not present

## 2019-12-17 DIAGNOSIS — R109 Unspecified abdominal pain: Secondary | ICD-10-CM | POA: Diagnosis not present

## 2019-12-17 DIAGNOSIS — G43809 Other migraine, not intractable, without status migrainosus: Secondary | ICD-10-CM

## 2019-12-17 DIAGNOSIS — Z6830 Body mass index (BMI) 30.0-30.9, adult: Secondary | ICD-10-CM

## 2019-12-17 DIAGNOSIS — E669 Obesity, unspecified: Secondary | ICD-10-CM | POA: Diagnosis not present

## 2019-12-17 MED ORDER — NOVOFINE PEN NEEDLE 32G X 6 MM MISC: 1 refills | Status: DC

## 2019-12-17 MED ORDER — ELETRIPTAN HYDROBROMIDE 20 MG PO TABS: 20.0000 mg | ORAL_TABLET | ORAL | 0 refills | Status: DC | PRN

## 2019-12-17 MED ORDER — GABAPENTIN 300 MG PO CAPS: 300.0000 mg | ORAL_CAPSULE | Freq: Three times a day (TID) | ORAL | 2 refills | Status: DC

## 2019-12-17 MED ORDER — SAXENDA 18 MG/3ML ~~LOC~~ SOPN: 0.6000 mg | PEN_INJECTOR | Freq: Every day | SUBCUTANEOUS | 2 refills | Status: DC

## 2019-12-17 NOTE — Patient Instructions (Signed)
Take topamax 100mg  nightly for one week, then 50 mg one week and stop.      Migraine Headache A migraine headache is a very strong throbbing pain on one side or both sides of your head. This type of headache can also cause other symptoms. It can last from 4 hours to 3 days. Talk with your doctor about what things may bring on (trigger) this condition. What are the causes? The exact cause of this condition is not known. This condition may be triggered or caused by:  Drinking alcohol.  Smoking.  Taking medicines, such as: ? Medicine used to treat chest pain (nitroglycerin). ? Birth control pills. ? Estrogen. ? Some blood pressure medicines.  Eating or drinking certain products.  Doing physical activity. Other things that may trigger a migraine headache include:  Having a menstrual period.  Pregnancy.  Hunger.  Stress.  Not getting enough sleep or getting too much sleep.  Weather changes.  Tiredness (fatigue). What increases the risk?  Being 88-1 years old.  Being female.  Having a family history of migraine headaches.  Being Caucasian.  Having depression or anxiety.  Being very overweight. What are the signs or symptoms?  A throbbing pain. This pain may: ? Happen in any area of the head, such as on one side or both sides. ? Make it hard to do daily activities. ? Get worse with physical activity. ? Get worse around bright lights or loud noises.  Other symptoms may include: ? Feeling sick to your stomach (nauseous). ? Vomiting. ? Dizziness. ? Being sensitive to bright lights, loud noises, or smells.  Before you get a migraine headache, you may get warning signs (an aura). An aura may include: ? Seeing flashing lights or having blind spots. ? Seeing bright spots, halos, or zigzag lines. ? Having tunnel vision or blurred vision. ? Having numbness or a tingling feeling. ? Having trouble talking. ? Having weak muscles.  Some people have symptoms after  a migraine headache (postdromal phase), such as: ? Tiredness. ? Trouble thinking (concentrating). How is this treated?  Taking medicines that: ? Relieve pain. ? Relieve the feeling of being sick to your stomach. ? Prevent migraine headaches.  Treatment may also include: ? Having acupuncture. ? Avoiding foods that bring on migraine headaches. ? Learning ways to control your body functions (biofeedback). ? Therapy to help you know and deal with negative thoughts (cognitive behavioral therapy). Follow these instructions at home: Medicines  Take over-the-counter and prescription medicines only as told by your doctor.  Ask your doctor if the medicine prescribed to you: ? Requires you to avoid driving or using heavy machinery. ? Can cause trouble pooping (constipation). You may need to take these steps to prevent or treat trouble pooping:  Drink enough fluid to keep your pee (urine) pale yellow.  Take over-the-counter or prescription medicines.  Eat foods that are high in fiber. These include beans, whole grains, and fresh fruits and vegetables.  Limit foods that are high in fat and sugar. These include fried or sweet foods. Lifestyle  Do not drink alcohol.  Do not use any products that contain nicotine or tobacco, such as cigarettes, e-cigarettes, and chewing tobacco. If you need help quitting, ask your doctor.  Get at least 8 hours of sleep every night.  Limit and deal with stress. General instructions      Keep a journal to find out what may bring on your migraine headaches. For example, write down: ? What you eat  and drink. ? How much sleep you get. ? Any change in what you eat or drink. ? Any change in your medicines.  If you have a migraine headache: ? Avoid things that make your symptoms worse, such as bright lights. ? It may help to lie down in a dark, quiet room. ? Do not drive or use heavy machinery. ? Ask your doctor what activities are safe for  you.  Keep all follow-up visits as told by your doctor. This is important. Contact a doctor if:  You get a migraine headache that is different or worse than others you have had.  You have more than 15 headache days in one month. Get help right away if:  Your migraine headache gets very bad.  Your migraine headache lasts longer than 72 hours.  You have a fever.  You have a stiff neck.  You have trouble seeing.  Your muscles feel weak or like you cannot control them.  You start to lose your balance a lot.  You start to have trouble walking.  You pass out (faint).  You have a seizure. Summary  A migraine headache is a very strong throbbing pain on one side or both sides of your head. These headaches can also cause other symptoms.  This condition may be treated with medicines and changes to your lifestyle.  Keep a journal to find out what may bring on your migraine headaches.  Contact a doctor if you get a migraine headache that is different or worse than others you have had.  Contact your doctor if you have more than 15 headache days in a month. This information is not intended to replace advice given to you by your health care provider. Make sure you discuss any questions you have with your health care provider. Document Revised: 06/05/2018 Document Reviewed: 03/26/2018 Elsevier Patient Education  2020 [employer].

## 2019-12-17 NOTE — Progress Notes (Signed)
Established patient visit   Patient: Charlene Freeman   DOB: Aug 22, 1970   49 y.o. Female  MRN: [id number] Visit Date: 12/17/2019  Today's healthcare provider: Trey Sailors, PA-C   Chief Complaint  Patient presents with  . Migraine   Subjective    HPI  Follow up for migraine  The patient was last seen for this 1 years ago.  She reports fair compliance with treatment. She feels that condition is Unchanged. She is having side effects. Patient reports that she has had 3 migraines this week. She reports that she takes Topamax and it has not been helping. She has also used BC powder and it has not helped. She has nausea, sensitivity to light and sounds. She reports her maxalt is not working either. She used propranolol remotely for migraines. She has tried elavil for migraines and doesn't remember. She had relpax previously   BP Readings from Last 3 Encounters:  12/17/19 115/67  06/30/19 (!) 110/58  04/27/19 121/65   Pulse Readings from Last 4 Encounters:  12/17/19 100  06/30/19 (!) 102  04/27/19 80  05/26/18 98     Patient reports she has had abdominal pain starting back in 04/2019 where she was seen in the ER. She has had a colonoscopy with [facility] in [place] which was negative. She was told she had IBS and given a medicine that she can't recall which she reports gave her diarrhea. She was also seen by her OBGYN and had a pelvic ultrasound which was unremarkable.   Obesity: Patient reports recent weight gain. Previous trial of adipex resulted in migraines. Would like to try injectable.   Wt Readings from Last 3 Encounters:  12/17/19 186 lb (84.4 kg)  06/30/19 185 lb (83.9 kg)  04/27/19 185 lb (83.9 kg)         Medications: Outpatient Medications Prior to Visit  Medication Sig  . ALPRAZolam (XANAX) 1 MG tablet as needed.  . DULoxetine (CYMBALTA) 30 MG capsule Take 30 mg by mouth 2 (two) times daily.  Marland Kitchen ketorolac (TORADOL) 10 MG tablet Take 1 tablet  (10 mg total) by mouth every 6 (six) hours as needed.  . ondansetron (ZOFRAN-ODT) 8 MG disintegrating tablet Take 8 mg by mouth every 8 (eight) hours as needed for nausea or vomiting.  . rizatriptan (MAXALT) 10 MG tablet TAKE 1 TABLET BY MOUTH AT FIRST SIGN OF MIGRAINE AND MAY REPEAT IN 2 HOURS AS NEEDED  . sucralfate (CARAFATE) 1 g tablet Take 1 tablet (1 g total) by mouth 4 (four) times daily as needed.  . topiramate (TOPAMAX) 200 MG tablet Take 1 tablet (200 mg total) by mouth 1 dose over 46 hours.  . traZODone (DESYREL) 100 MG tablet Take 200 mg by mouth at bedtime.  . [DISCONTINUED] citalopram (CELEXA) 40 MG tablet Take 90 mg by mouth at bedtime. 1 &1/2 at bedtime   . [DISCONTINUED] doxycycline (VIBRA-TABS) 100 MG tablet Take 1 tablet (100 mg total) by mouth 2 (two) times daily.  . [DISCONTINUED] famotidine (PEPCID) 20 MG tablet Take 1 tablet (20 mg total) by mouth 2 (two) times daily for 15 days.  . [DISCONTINUED] meloxicam (MOBIC) 7.5 MG tablet Take 1-2 tablets daily as needed. (Patient not taking: Reported on 06/30/2019)   No facility-administered medications prior to visit.    Review of Systems  Constitutional: Positive for fatigue.  Eyes: Negative.   Respiratory: Negative.   Cardiovascular: Negative.   Musculoskeletal: Negative.   Neurological: Positive for light-headedness  and headaches.  Psychiatric/Behavioral: Positive for sleep disturbance. The patient is nervous/anxious.        Objective    BP 115/67   Pulse 100   Temp 98.4 F (36.9 C)   Resp 16   Ht 5\' 5"  (1.651 m)   Wt 186 lb (84.4 kg)   BMI 30.95 kg/m     Physical Exam Constitutional:      Appearance: Normal appearance.  Cardiovascular:     Rate and Rhythm: Normal rate and regular rhythm.     Heart sounds: Normal heart sounds.  Pulmonary:     Effort: Pulmonary effort is normal.     Breath sounds: Normal breath sounds.  Skin:    General: Skin is warm and dry.  Neurological:     Mental Status: She is  alert and oriented to person, place, and time. Mental status is at baseline.  Psychiatric:        Mood and Affect: Mood normal.        Behavior: Behavior normal.       No results found for any visits on 12/17/19.  Assessment & Plan    1. Major depressive disorder, remission status unspecified, unspecified whether recurrent  Continue cymbalta.   - TSH - Lipid panel - Comprehensive metabolic panel - CBC with Differential/Platelet  2. Other migraine without status migrainosus, not intractable  Worsening. Will taper off topamax and start gabapentin. See if insurance covers relpax. If gabapentin not successful, may consider ajovy or aimovig. Follow up 2 months.   - TSH - Lipid panel - Comprehensive metabolic panel - CBC with Differential/Platelet - gabapentin (NEURONTIN) 300 MG capsule; Take 1 capsule (300 mg total) by mouth 3 (three) times daily.  Dispense: 90 capsule; Refill: 2  3. Abdominal pain, unspecified abdominal location  - TSH - Lipid panel - Comprehensive metabolic panel - CBC with Differential/Platelet  4. Class 1 obesity without serious comorbidity with body mass index (BMI) of 30.0 to 30.9 in adult, unspecified obesity type  Follow up 2 months.   - Liraglutide -Weight Management (SAXENDA) 18 MG/3ML SOPN; Inject 0.6 mg into the skin daily.  Dispense: 3 mL; Refill: 2 - Insulin Pen Needle (NOVOFINE PEN NEEDLE) 32G X 6 MM MISC; Use daily for saxenda injection.  Dispense: 100 each; Refill: 1   No follow-ups on file.      I10/24/21, PA-C, have reviewed all documentation for this visit. The documentation on 12/17/19 for the exam, diagnosis, procedures, and orders are all accurate and complete.  The entirety of the information documented in the History of Present Illness, Review of Systems and Physical Exam were personally obtained by me. Portions of this information were initially documented by 12/19/19, CMA and reviewed by me for thoroughness and  accuracy.     Anson [place]  [facility] [phone] (phone) [phone] (fax)  [facility]

## 2019-12-18 LAB — COMPREHENSIVE METABOLIC PANEL
ALT: 13 IU/L (ref 0–32)
AST: 16 IU/L (ref 0–40)
Albumin/Globulin Ratio: 1.5 (ref 1.2–2.2)
Albumin: 4.1 g/dL (ref 3.8–4.8)
Alkaline Phosphatase: 102 IU/L (ref 44–121)
BUN/Creatinine Ratio: 11 (ref 9–23)
BUN: 13 mg/dL (ref 6–24)
Bilirubin Total: 0.3 mg/dL (ref 0.0–1.2)
CO2: 21 mmol/L (ref 20–29)
Calcium: 9.6 mg/dL (ref 8.7–10.2)
Chloride: 107 mmol/L — ABNORMAL HIGH (ref 96–106)
Creatinine, Ser: 1.19 mg/dL — ABNORMAL HIGH (ref 0.57–1.00)
GFR calc Af Amer: 63 mL/min/{1.73_m2} (ref >59)
GFR calc non Af Amer: 54 mL/min/{1.73_m2} — ABNORMAL LOW (ref >59)
Globulin, Total: 2.7 g/dL (ref 1.5–4.5)
Glucose: 88 mg/dL (ref 65–99)
Potassium: 4.4 mmol/L (ref 3.5–5.2)
Sodium: 141 mmol/L (ref 134–144)
Total Protein: 6.8 g/dL (ref 6.0–8.5)

## 2019-12-18 LAB — CBC WITH DIFFERENTIAL/PLATELET
Basophils Absolute: 0 10*3/uL (ref 0.0–0.2)
Basos: 1 %
EOS (ABSOLUTE): 0.2 10*3/uL (ref 0.0–0.4)
Eos: 3 %
Hematocrit: 40.4 % (ref 34.0–46.6)
Hemoglobin: 13.7 g/dL (ref 11.1–15.9)
Immature Grans (Abs): 0 10*3/uL (ref 0.0–0.1)
Immature Granulocytes: 0 %
Lymphocytes Absolute: 1.8 10*3/uL (ref 0.7–3.1)
Lymphs: 32 %
MCH: 30.2 pg (ref 26.6–33.0)
MCHC: 33.9 g/dL (ref 31.5–35.7)
MCV: 89 fL (ref 79–97)
Monocytes Absolute: 0.4 10*3/uL (ref 0.1–0.9)
Monocytes: 8 %
Neutrophils Absolute: 3.1 10*3/uL (ref 1.4–7.0)
Neutrophils: 56 %
Platelets: 187 10*3/uL (ref 150–450)
RBC: 4.54 x10E6/uL (ref 3.77–5.28)
RDW: 12.2 % (ref 11.7–15.4)
WBC: 5.6 10*3/uL (ref 3.4–10.8)

## 2019-12-18 LAB — LIPID PANEL
Chol/HDL Ratio: 4.1 ratio (ref 0.0–4.4)
Cholesterol, Total: 262 mg/dL — ABNORMAL HIGH (ref 100–199)
HDL: 64 mg/dL (ref >39)
LDL Chol Calc (NIH): 183 mg/dL — ABNORMAL HIGH (ref 0–99)
Triglycerides: 86 mg/dL (ref 0–149)
VLDL Cholesterol Cal: 15 mg/dL (ref 5–40)

## 2019-12-18 LAB — TSH: TSH: 1.18 u[IU]/mL (ref 0.450–4.500)

## 2020-01-12 ENCOUNTER — Other Ambulatory Visit: Payer: Self-pay | Admitting: Physician Assistant

## 2020-01-12 DIAGNOSIS — G43809 Other migraine, not intractable, without status migrainosus: Secondary | ICD-10-CM

## 2020-01-12 DIAGNOSIS — F3342 Major depressive disorder, recurrent, in full remission: Secondary | ICD-10-CM | POA: Diagnosis not present

## 2020-01-12 DIAGNOSIS — F411 Generalized anxiety disorder: Secondary | ICD-10-CM | POA: Diagnosis not present

## 2020-01-14 ENCOUNTER — Ambulatory Visit: Payer: Self-pay

## 2020-01-14 NOTE — Telephone Encounter (Signed)
°   PM 1  Excell Seltzer Female, 49 y.o., [birth year] MRN:  [id number] Phone:  [phone] (M) ... PCP:  Trey Sailors, PA-C Coverage:  [employer] Ppo Next Appt With Family Medicine 03/03/2020 at 1:00 PM Message from Crist Infante sent at 01/14/2020 3:13 PM EST  Pt has a question about Liraglutide -Weight Management (SAXENDA) 18 MG/3ML SOPN and how the Rx is written   Call History   Type Contact Phone User  01/14/2020 03:13 PM EST Phone (Incoming) Saddie, Sandeen (Self) [phone] Rexene Edison) Crist Infante  Pt. States she picked up her Saxenda prescription and pharmacist told her it was not written correctly. "It needs to increased weekly to a maximum dose of 3 mg daily." Please  Advise pt.

## 2020-01-17 DIAGNOSIS — N907 Vulvar cyst: Secondary | ICD-10-CM | POA: Diagnosis not present

## 2020-01-28 NOTE — Telephone Encounter (Signed)
Usually I start off at a lower dose until I know she is tolerating it. Did she pick up the Rx and start it? We usually discuss how people are doing at the follow up.

## 2020-01-31 NOTE — Telephone Encounter (Signed)
Patient was advised and states that she is going well with the lower dose and will see you at the follow-up appointment. Just a [place]

## 2020-03-03 ENCOUNTER — Other Ambulatory Visit: Payer: Self-pay

## 2020-03-03 ENCOUNTER — Ambulatory Visit (INDEPENDENT_AMBULATORY_CARE_PROVIDER_SITE_OTHER): Payer: BC Managed Care – PPO | Admitting: Physician Assistant

## 2020-03-03 ENCOUNTER — Encounter: Payer: Self-pay | Admitting: Physician Assistant

## 2020-03-03 VITALS — BP 109/57 | HR 95 | Temp 98.4°F | Wt 187.5 lb

## 2020-03-03 DIAGNOSIS — Z1211 Encounter for screening for malignant neoplasm of colon: Secondary | ICD-10-CM | POA: Diagnosis not present

## 2020-03-03 DIAGNOSIS — R634 Abnormal weight loss: Secondary | ICD-10-CM | POA: Diagnosis not present

## 2020-03-03 DIAGNOSIS — G43809 Other migraine, not intractable, without status migrainosus: Secondary | ICD-10-CM | POA: Diagnosis not present

## 2020-03-03 MED ORDER — ELETRIPTAN HYDROBROMIDE 20 MG PO TABS: ORAL_TABLET | ORAL | 2 refills | Status: DC

## 2020-03-03 MED ORDER — WEGOVY 0.5 MG/0.5ML ~~LOC~~ SOAJ: 0.5000 mg | SUBCUTANEOUS | 0 refills | Status: DC

## 2020-03-03 NOTE — Progress Notes (Signed)
Established patient visit   Patient: Charlene Freeman   DOB: May 03, 1970   50 y.o. Female  MRN: [id number] Visit Date: 03/03/2020  Today's healthcare provider: Trey Sailors, PA-C   Chief Complaint  Patient presents with  . Obesity  . Weight Loss  I,Morayo Leven M Salwa Bai,acting as a scribe for [employer], PA-C.,have documented all relevant documentation on the behalf of Trey Sailors, PA-C,as directed by  Trey Sailors, PA-C while in the presence of Trey Sailors, PA-C.  Subjective    HPI  Weight Loss Patient presents today for weight loss. She started on Liraglutide -Weight Management (SAXENDA) 18 MG/3ML SOPN. Patient reports good compliance with treatment.  Has been using it at 0.6 mg daily dosage. Interested in trying [place].   Migraines: Migraines have improved significantly since starting gabapentin. Relpax is also working well and her insurance is covering this now.     Medications: Outpatient Medications Prior to Visit  Medication Sig  . ALPRAZolam (XANAX) 1 MG tablet as needed.  . DULoxetine (CYMBALTA) 30 MG capsule Take 30 mg by mouth 2 (two) times daily.  . Insulin Pen Needle (NOVOFINE PEN NEEDLE) 32G X 6 MM MISC Use daily for saxenda injection.  . ondansetron (ZOFRAN-ODT) 8 MG disintegrating tablet Take 8 mg by mouth every 8 (eight) hours as needed for nausea or vomiting.  . traZODone (DESYREL) 100 MG tablet Take 200 mg by mouth at bedtime.  . [DISCONTINUED] eletriptan (RELPAX) 20 MG tablet TAKE 1 TABLET BY MOUTH AS NEEDED FOR MIGRAINE OR HEADACHE, MAY REPEAT IN 2 HOURS IF HEADACHE PERSISTS OR RECURS  . [DISCONTINUED] gabapentin (NEURONTIN) 300 MG capsule Take 1 capsule (300 mg total) by mouth 3 (three) times daily.  . [DISCONTINUED] Liraglutide -Weight Management (SAXENDA) 18 MG/3ML SOPN Inject 0.6 mg into the skin daily.  . [DISCONTINUED] ketorolac (TORADOL) 10 MG tablet Take 1 tablet (10 mg total) by mouth every 6 (six) hours as needed. (Patient  not taking: Reported on 03/03/2020)  . [DISCONTINUED] sucralfate (CARAFATE) 1 g tablet Take 1 tablet (1 g total) by mouth 4 (four) times daily as needed. (Patient not taking: Reported on 03/03/2020)  . [DISCONTINUED] topiramate (TOPAMAX) 200 MG tablet Take 1 tablet (200 mg total) by mouth 1 dose over 46 hours. (Patient not taking: Reported on 03/03/2020)   No facility-administered medications prior to visit.    Review of Systems  Constitutional: Negative.   Respiratory: Negative.   Cardiovascular: Negative.       Objective    BP (!) 109/57 (BP Location: Left Arm, Patient Position: Sitting, Cuff Size: Large)   Pulse 95   Temp 98.4 F (36.9 C) (Oral)   Wt 187 lb 8 oz (85 kg)   SpO2 98%   BMI 31.20 kg/m    Physical Exam Constitutional:      Appearance: Normal appearance. She is obese.  Cardiovascular:     Rate and Rhythm: Normal rate and regular rhythm.     Heart sounds: Normal heart sounds.  Pulmonary:     Effort: Pulmonary effort is normal.     Breath sounds: Normal breath sounds.  Skin:    General: Skin is warm and dry.  Neurological:     General: No focal deficit present.     Mental Status: She is alert and oriented to person, place, and time.  Psychiatric:        Mood and Affect: Mood normal.        Behavior: Behavior  normal.       No results found for any visits on 03/03/20.  Assessment & Plan    1. Weight loss  Change to [facility]. Titrate up as tolerated.  - Semaglutide-Weight Management (WEGOVY) 0.5 MG/0.5ML SOAJ; Inject 0.5 mg into the skin once a week.  Dispense: 2 mL; Refill: 0  2. Other migraine without status migrainosus, not intractable  - eletriptan (RELPAX) 20 MG tablet; TAKE 1 TABLET BY MOUTH AS NEEDED FOR MIGRAINE OR HEADACHE, MAY REPEAT IN 2 HOURS IF HEADACHE PERSISTS OR RECURS  Dispense: 10 tablet; Refill: 2  3. Colon cancer screening  She got this done near [place] last year and will send the results through MyChart.    No follow-ups on  file.      ITrey Sailors, PA-C, have reviewed all documentation for this visit. The documentation on 03/07/20 for the exam, diagnosis, procedures, and orders are all accurate and complete.  The entirety of the information documented in the History of Present Illness, Review of Systems and Physical Exam were personally obtained by me. Portions of this information were initially documented by [facility] and reviewed by me for thoroughness and accuracy.     Maryella Shivers  [facility] [phone] (phone) [phone] (fax)  [facility]

## 2020-03-06 ENCOUNTER — Other Ambulatory Visit: Payer: Self-pay | Admitting: Physician Assistant

## 2020-03-06 DIAGNOSIS — G43809 Other migraine, not intractable, without status migrainosus: Secondary | ICD-10-CM

## 2020-03-27 ENCOUNTER — Encounter: Payer: Self-pay | Admitting: Physician Assistant

## 2020-03-27 DIAGNOSIS — E669 Obesity, unspecified: Secondary | ICD-10-CM

## 2020-03-27 MED ORDER — WEGOVY 1 MG/0.5ML ~~LOC~~ SOAJ: 1.0000 mg | SUBCUTANEOUS | 0 refills | Status: DC

## 2020-04-04 ENCOUNTER — Other Ambulatory Visit: Payer: Self-pay | Admitting: Physician Assistant

## 2020-04-04 NOTE — Telephone Encounter (Signed)
Requested medication (s) are due for refill today: yes  Requested medication (s) are on the active medication list: yes  Last refill:  03/27/20, 67ml  Future visit scheduled: no  Notes to clinic:  Please review for refill. No protocol available to delegate refill    Requested Prescriptions  Pending Prescriptions Disp Refills   WEGOVY 0.5 MG/0.5ML SOAJ [Pharmacy Med Name: WEGOVY 0.5 MG/0.5 ML PEN[R]] 2 mL 0    Sig: INJECT CONTENTS OF ONE PEN (0.5 MG) INTO THE SKIN ONCE A WEEK      There is no refill protocol information for this order

## 2020-04-05 MED ORDER — WEGOVY 1 MG/0.5ML ~~LOC~~ SOAJ: 1.0000 mg | SUBCUTANEOUS | 0 refills | Status: DC

## 2020-04-05 NOTE — Addendum Note (Signed)
Addended by: Trey Sailors on: 04/05/2020 08:12 AM   Modules accepted: Orders

## 2020-04-20 ENCOUNTER — Encounter: Payer: Self-pay | Admitting: Physician Assistant

## 2020-04-20 DIAGNOSIS — E669 Obesity, unspecified: Secondary | ICD-10-CM

## 2020-04-24 MED ORDER — WEGOVY 2.4 MG/0.75ML ~~LOC~~ SOAJ: 2.4000 mg | SUBCUTANEOUS | 1 refills | Status: DC

## 2020-04-24 MED ORDER — WEGOVY 1.7 MG/0.75ML ~~LOC~~ SOAJ: 1.7000 mg | SUBCUTANEOUS | 0 refills | Status: DC

## 2020-04-24 NOTE — Progress Notes (Signed)
Established patient visit   Patient: Charlene Freeman   DOB: 03/06/1970   50 y.o. Female  MRN: [id number] Visit Date: 04/25/2020  Today's healthcare provider: Trey Sailors, PA-C   Chief Complaint  Patient presents with  . Migraine  I,Kayleb Warshaw M Bryony Kaman,acting as a scribe for Trey Sailors, PA-C.,have documented all relevant documentation on the behalf of Trey Sailors, PA-C,as directed by  Trey Sailors, PA-C while in the presence of Trey Sailors, PA-C.   Subjective    HPI  Follow up for migraines  The patient was last seen for this 1 months ago. Changes made at last visit include continue current medication.  She reports good compliance with treatment. She feels that condition is Worse. Reports gabapentin is not helping to prevent migraines as well as before. She is using relpax for abortive therapy which is helpful but doesn't completely remove the migraine. She is currently on cymbalta and has historically tried topamax, imitrex, and maxalt for migraines without success. She would like to try nurtec for migraine prophylaxis.   She is having side effects.   Wt Readings from Last 3 Encounters:  04/25/20 185 lb 3.2 oz (84 kg)  03/03/20 187 lb 8 oz (85 kg)  12/17/19 186 lb (84.4 kg)    -----------------------------------------------------------------------------------------       Medications: Outpatient Medications Prior to Visit  Medication Sig  . ALPRAZolam (XANAX) 1 MG tablet as needed.  . DULoxetine (CYMBALTA) 30 MG capsule Take 30 mg by mouth 2 (two) times daily.  Marland Kitchen gabapentin (NEURONTIN) 300 MG capsule TAKE 1 CAPSULE(300 MG) BY MOUTH THREE TIMES DAILY  . Insulin Pen Needle (NOVOFINE PEN NEEDLE) 32G X 6 MM MISC Use daily for saxenda injection.  . ondansetron (ZOFRAN-ODT) 8 MG disintegrating tablet Take 8 mg by mouth every 8 (eight) hours as needed for nausea or vomiting.  . Semaglutide-Weight Management (WEGOVY) 1.7 MG/0.75ML SOAJ Inject 1.7  mg into the skin once a week.  . Semaglutide-Weight Management (WEGOVY) 2.4 MG/0.75ML SOAJ Inject 2.4 mg into the skin once a week.  . traZODone (DESYREL) 100 MG tablet Take 200 mg by mouth at bedtime.  . [DISCONTINUED] eletriptan (RELPAX) 20 MG tablet TAKE 1 TABLET BY MOUTH AS NEEDED FOR MIGRAINE OR HEADACHE, MAY REPEAT IN 2 HOURS IF HEADACHE PERSISTS OR RECURS   No facility-administered medications prior to visit.    Review of Systems  Neurological: Positive for headaches.       Objective    BP 109/74 (BP Location: Left Arm, Patient Position: Sitting, Cuff Size: Normal)   Pulse 89   Temp 97.9 F (36.6 C) (Oral)   Wt 185 lb 3.2 oz (84 kg)   LMP 02/14/2019 (Exact Date)   SpO2 98%   BMI 30.82 kg/m     Physical Exam Constitutional:      Appearance: Normal appearance. She is obese.  Skin:    General: Skin is warm and dry.  Neurological:     General: No focal deficit present.     Mental Status: She is alert and oriented to person, place, and time.  Psychiatric:        Mood and Affect: Mood normal.        Behavior: Behavior normal.       No results found for any visits on 04/25/20.  Assessment & Plan    1. Other migraine without status migrainosus, not intractable  Nurtec denied as prophylaxis. Insurance wants her to try aimovig, Arnetha Massy  or emgality.   - Ambulatory referral to Neurology - Rimegepant Sulfate (NURTEC) 75 MG TBDP; Take one tablet every other day.  Dispense: 45 tablet; Refill: 1 - eletriptan (RELPAX) 20 MG tablet; TAKE 1 TABLET BY MOUTH AS NEEDED FOR MIGRAINE OR HEADACHE, MAY REPEAT IN 2 HOURS IF HEADACHE PERSISTS OR RECURS  Dispense: 12 tablet; Refill: 2  2. Obesity  She would like to continue [facility]. Will titrate up as tolerated monthly. Follow up in 3-4 months.   Return in about 3 months (around 07/26/2020) for migraines.      ITrey Sailors, PA-C, have reviewed all documentation for this visit. The documentation on 05/09/20 for the exam,  diagnosis, procedures, and orders are all accurate and complete.  The entirety of the information documented in the History of Present Illness, Review of Systems and Physical Exam were personally obtained by me. Portions of this information were initially documented by [facility] and reviewed by me for thoroughness and accuracy.     Maryella Shivers  [facility] [phone] (phone) [phone] (fax)  [facility]

## 2020-04-25 ENCOUNTER — Encounter: Payer: Self-pay | Admitting: Physician Assistant

## 2020-04-25 ENCOUNTER — Other Ambulatory Visit: Payer: Self-pay

## 2020-04-25 ENCOUNTER — Ambulatory Visit (INDEPENDENT_AMBULATORY_CARE_PROVIDER_SITE_OTHER): Payer: BC Managed Care – PPO | Admitting: Physician Assistant

## 2020-04-25 VITALS — BP 109/74 | HR 89 | Temp 97.9°F | Wt 185.2 lb

## 2020-04-25 DIAGNOSIS — G43809 Other migraine, not intractable, without status migrainosus: Secondary | ICD-10-CM

## 2020-04-25 MED ORDER — NURTEC 75 MG PO TBDP: ORAL_TABLET | ORAL | 1 refills | Status: DC

## 2020-04-25 MED ORDER — ELETRIPTAN HYDROBROMIDE 20 MG PO TABS: ORAL_TABLET | ORAL | 2 refills | Status: DC

## 2020-04-25 NOTE — Patient Instructions (Signed)
Cervicogenic Headache  A cervicogenic headache is a headache caused by a condition that affects the bones and tissues in your neck (cervical spine). In a cervicogenic headache, the pain moves from your neck to your head. Most cervicogenic headaches start in the upper part of the neck with the first three cervical bones (cervical vertebrae). A cervicogenic headache is diagnosed when a cause can be found in the cervical spine and other causes of headaches can be ruled out. What are the causes? The most common cause of this condition is a traumatic injury to the cervical spine, such as whiplash. Other causes include:  Arthritis.  Broken bone (fracture).  Infection.  Tumor. What are the signs or symptoms? The most common symptoms are neck and head pain. The pain is often located on one side. In some cases, there may be head pain without neck pain. Pain may be felt in the neck, back or side of the head, face, or behind the eyes. Other symptoms include:  Limited movement in the neck.  Arm or shoulder pain. How is this diagnosed? This condition may be diagnosed based on:  Your symptoms.  A physical exam.  An injection that blocks nerve signals (nerve block).  Imaging tests, such as: ? X-rays. ? CT scan. ? MRI. How is this treated? Treatment for this condition may depend on the underlying condition. Treatment may include:  Medicines, such as: ? NSAIDs. ? Muscle relaxants.  Physical therapy.  Massage therapy.  Complementary therapies, such as: ? Biofeedback. ? Meditation. ? Acupuncture.  Nerve block injections.  Botulinum toxin injections. Your treatment plan may involve working with a pain management team that includes your primary health care provider, a pain management specialist, a neurologist, and a physical therapist. Follow these instructions at home:  Take over-the-counter and prescription medicines only as told by your health care provider.  Do exercises at  home as told by your physical therapist.  Return to your normal activities as told by your health care provider. Ask your health care provider what activities are safe for you. Avoid activities that trigger your headaches.  Maintain good neck support and posture at home and at work.  Keep all follow-up visits as told by your health care provider. This is important. Contact a health care provider if you have:  Headaches that are getting worse and happening more often.  Headaches with any of the following: ? Fever. ? Numbness. ? Weakness. ? Dizziness. ? Nausea or vomiting. Get help right away if:  You have a very sudden and severe headache. Summary  A cervicogenic headache is a headache caused by a condition that affects the bones and tissues in your cervical spine.  Your health care provider may diagnose this condition with a physical exam, a nerve block, and imaging tests.  Treatment may include medicine to reduce pain and inflammation, physical therapy, and nerve block injections.  Complementary therapies, such as acupuncture and meditation, may be added to other treatments.  Your treatment plan may involve working with a pain management team that includes your primary health care provider, a pain management specialist, a neurologist, and a physical therapist. This information is not intended to replace advice given to you by your health care provider. Make sure you discuss any questions you have with your health care provider. Document Revised: 12/23/2019 Document Reviewed: 12/23/2019 Elsevier Patient Education  2021 Elsevier Inc.  

## 2020-05-04 ENCOUNTER — Telehealth: Payer: Self-pay | Admitting: Physician Assistant

## 2020-05-04 NOTE — Telephone Encounter (Signed)
Has been forwarded to proper office.

## 2020-05-04 NOTE — Telephone Encounter (Signed)
covermymeds calling to follow up on Rx   Semaglutide-Weight Management (WEGOVY) 1.7 MG/0.75ML SOAJ  Semaglutide-Weight Management (WEGOVY) 2.4 MG/0.75ML SOAJ  They faxed the information needed to follow through on appeal.. They also need letter of consent, and the template for that letter is found on their web site.  cb [phone]  Ref key [id number]

## 2020-05-08 DIAGNOSIS — F40298 Other specified phobia: Secondary | ICD-10-CM | POA: Diagnosis not present

## 2020-05-08 DIAGNOSIS — M542 Cervicalgia: Secondary | ICD-10-CM | POA: Diagnosis not present

## 2020-05-08 DIAGNOSIS — R519 Headache, unspecified: Secondary | ICD-10-CM | POA: Diagnosis not present

## 2020-05-08 DIAGNOSIS — H53149 Visual discomfort, unspecified: Secondary | ICD-10-CM | POA: Diagnosis not present

## 2020-05-08 NOTE — Telephone Encounter (Signed)
She pays out of pocket.

## 2020-05-09 ENCOUNTER — Telehealth: Payer: Self-pay | Admitting: Physician Assistant

## 2020-05-09 DIAGNOSIS — G43809 Other migraine, not intractable, without status migrainosus: Secondary | ICD-10-CM

## 2020-05-09 NOTE — Telephone Encounter (Signed)
Can we let patient know her insurance denied Nurtec. Alternatives they suggested are aimovig, emgality, and ajovy. These are injectable migraine prophylaxis medicines. They would also require their own PA but if she would like to try one of these we can send for it.

## 2020-05-11 NOTE — Telephone Encounter (Signed)
Patient was advised and states she went to the neurologist and he would not do the neck injections. Patient would like to see if you could change her  Relpax dose to 40 mg since she was taking 20 mg twice. She reports that her insurance will cover 12 tablets. Please advise.

## 2020-05-12 MED ORDER — ELETRIPTAN HYDROBROMIDE 40 MG PO TABS: ORAL_TABLET | ORAL | 0 refills | Status: DC

## 2020-05-12 NOTE — Telephone Encounter (Signed)
Patient was advised.  

## 2020-05-12 NOTE — Telephone Encounter (Signed)
Got that sent in.

## 2020-06-23 DIAGNOSIS — M436 Torticollis: Secondary | ICD-10-CM | POA: Diagnosis not present

## 2020-06-23 DIAGNOSIS — M542 Cervicalgia: Secondary | ICD-10-CM | POA: Diagnosis not present

## 2020-07-03 DIAGNOSIS — M542 Cervicalgia: Secondary | ICD-10-CM | POA: Diagnosis not present

## 2020-07-03 DIAGNOSIS — R519 Headache, unspecified: Secondary | ICD-10-CM | POA: Diagnosis not present

## 2020-07-03 DIAGNOSIS — G8929 Other chronic pain: Secondary | ICD-10-CM | POA: Insufficient documentation

## 2020-07-03 DIAGNOSIS — R200 Anesthesia of skin: Secondary | ICD-10-CM | POA: Diagnosis not present

## 2020-07-03 DIAGNOSIS — H53149 Visual discomfort, unspecified: Secondary | ICD-10-CM | POA: Diagnosis not present

## 2020-07-04 DIAGNOSIS — F3342 Major depressive disorder, recurrent, in full remission: Secondary | ICD-10-CM | POA: Diagnosis not present

## 2020-07-04 DIAGNOSIS — F900 Attention-deficit hyperactivity disorder, predominantly inattentive type: Secondary | ICD-10-CM | POA: Diagnosis not present

## 2020-07-04 DIAGNOSIS — F4321 Adjustment disorder with depressed mood: Secondary | ICD-10-CM | POA: Diagnosis not present

## 2020-07-04 DIAGNOSIS — F410 Panic disorder [episodic paroxysmal anxiety] without agoraphobia: Secondary | ICD-10-CM | POA: Diagnosis not present

## 2020-07-07 ENCOUNTER — Other Ambulatory Visit: Payer: Self-pay

## 2020-07-07 DIAGNOSIS — E669 Obesity, unspecified: Secondary | ICD-10-CM

## 2020-07-07 MED ORDER — WEGOVY 2.4 MG/0.75ML ~~LOC~~ SOAJ: 2.4000 mg | SUBCUTANEOUS | 1 refills | Status: DC

## 2020-07-07 NOTE — Telephone Encounter (Signed)
[facility] faxed refill request for the following medications:  Semaglutide-Weight Management (WEGOVY) 2.4 MG/0.75ML SOAJ   Please advise.

## 2020-07-26 DIAGNOSIS — M436 Torticollis: Secondary | ICD-10-CM | POA: Diagnosis not present

## 2020-07-26 DIAGNOSIS — M542 Cervicalgia: Secondary | ICD-10-CM | POA: Diagnosis not present

## 2020-09-04 IMAGING — US US ABDOMEN LIMITED
1 series · 14 of 25 positions shown · non-contrast
Comparison: Ultrasound 11/01/2003

CLINICAL DATA: Epigastric pain

EXAM:
ULTRASOUND ABDOMEN LIMITED RIGHT UPPER QUADRANT

[Series 1: us abdomen limited · 14 of 43 slices shown]
[im 1/43]
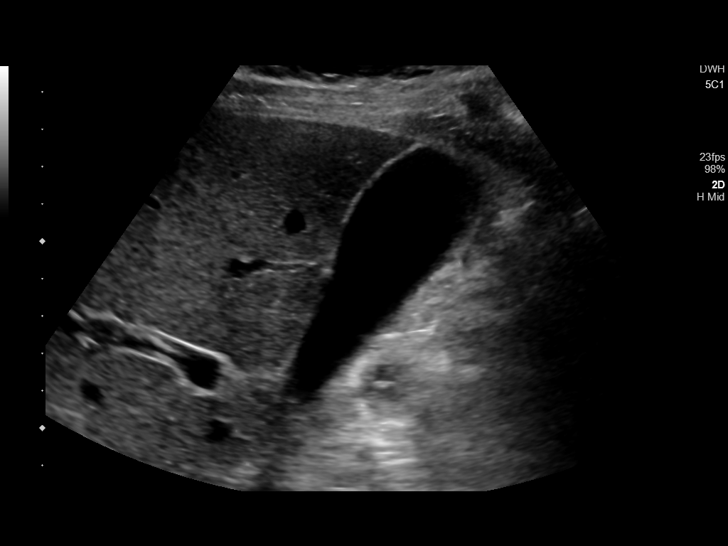
[im 4/43]
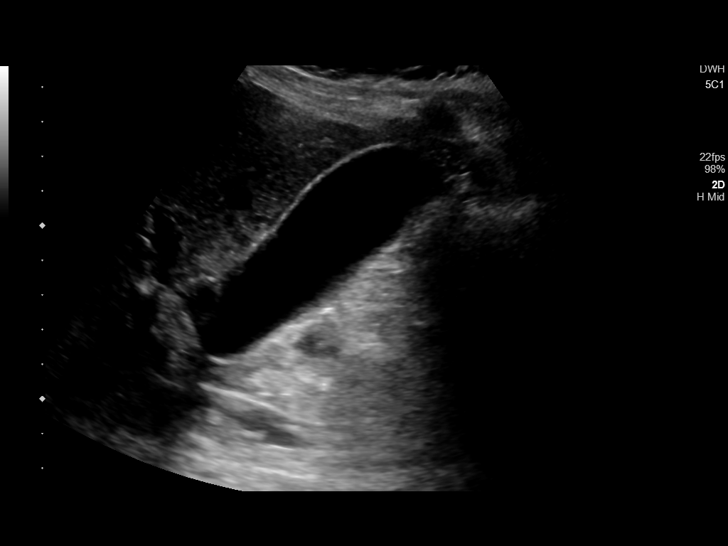
[im 8/43]
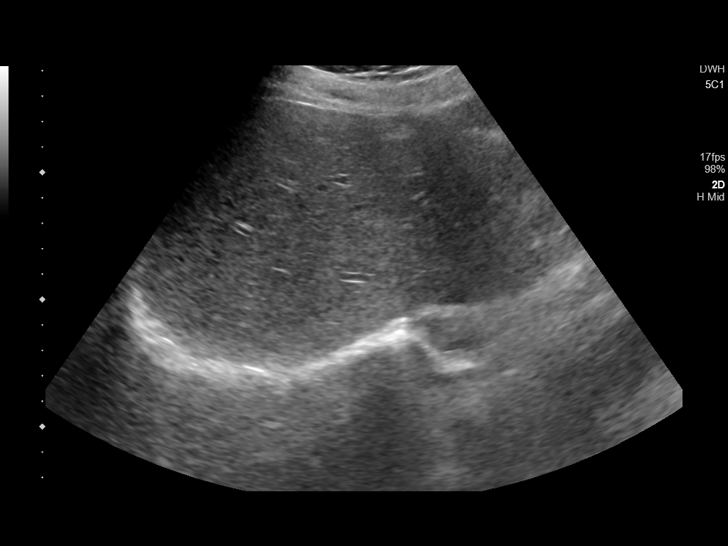
[im 11/43]
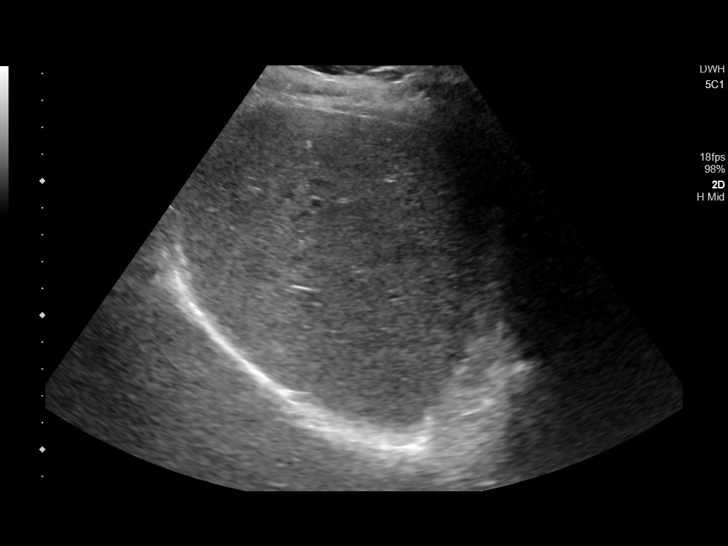
[im 15/43]
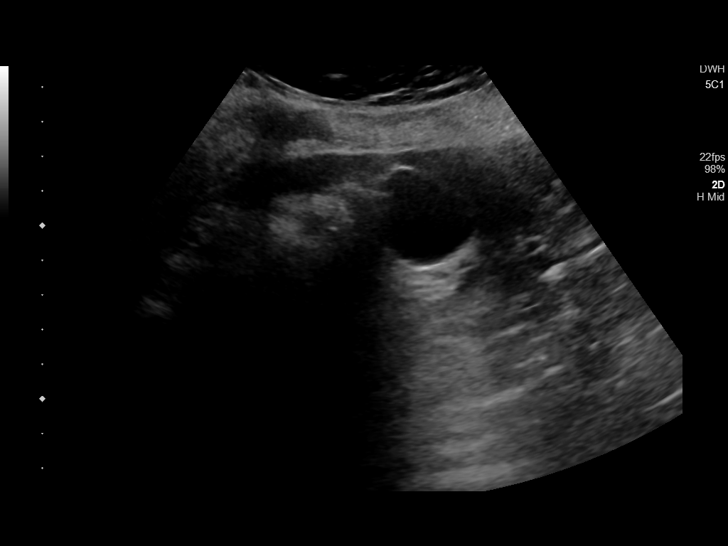
[im 16/43]
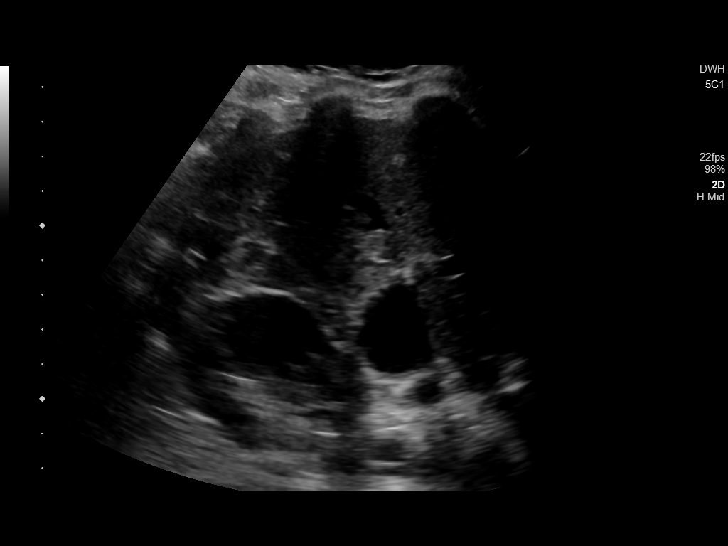
[im 20/43]
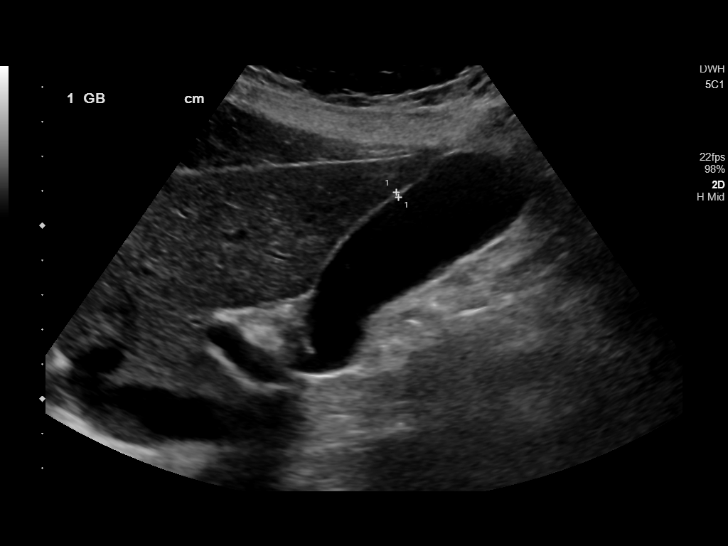
[im 23/43]
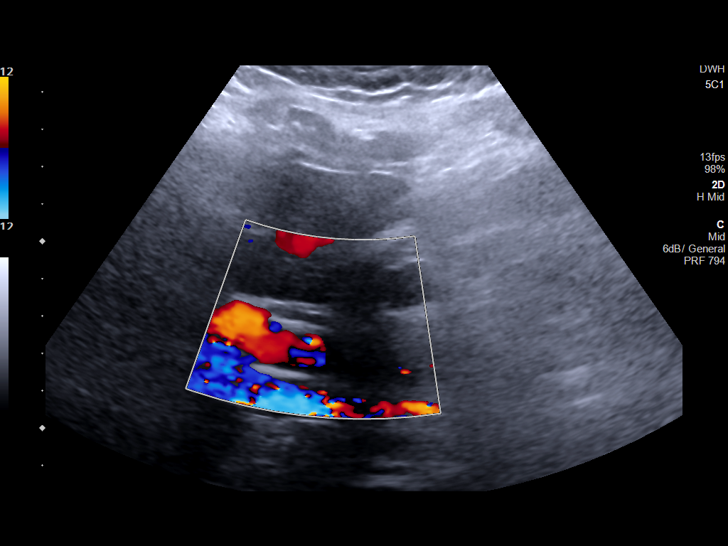
[im 27/43]
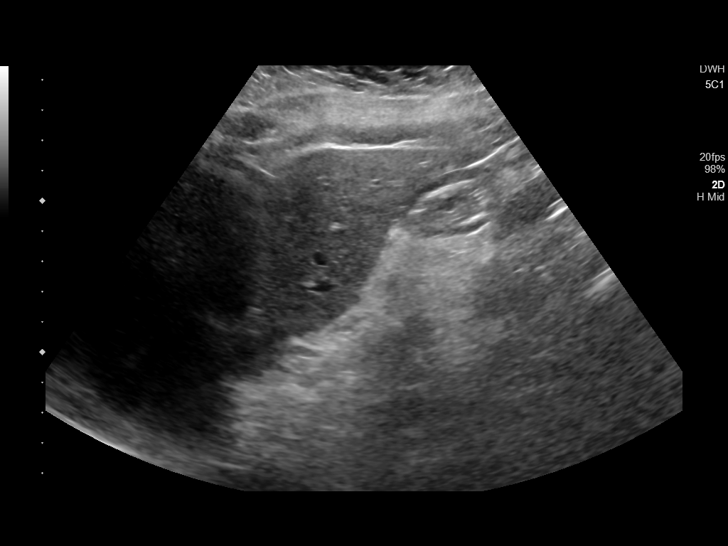
[im 29/43]
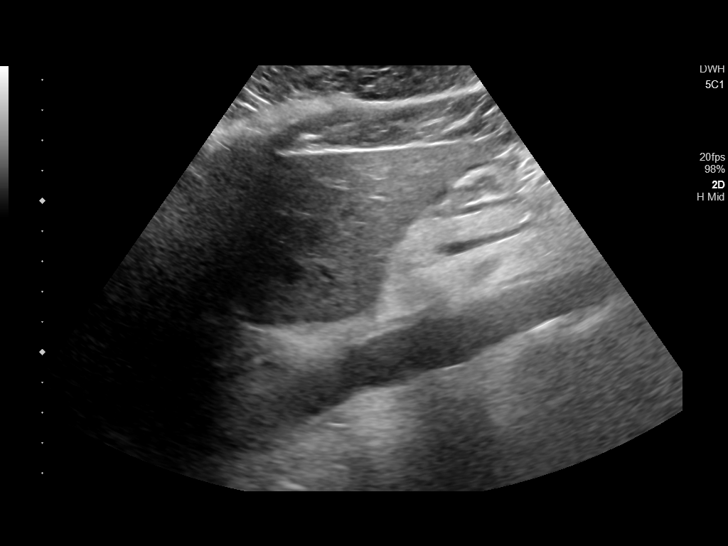
[im 32/43]
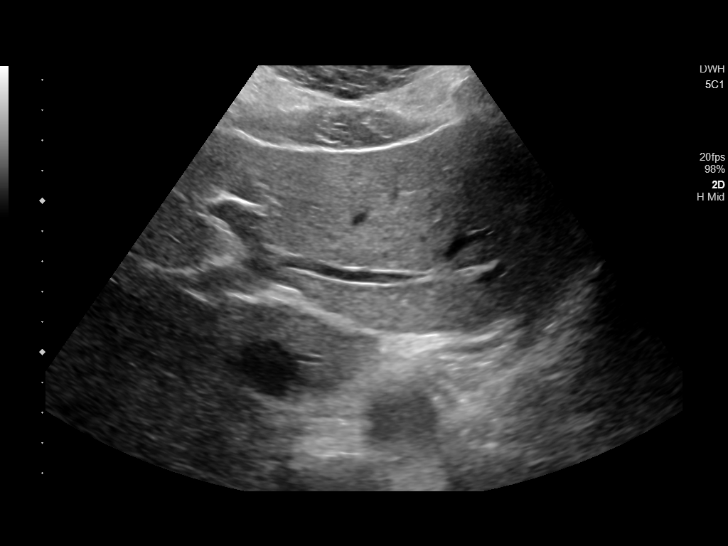
[im 36/43]
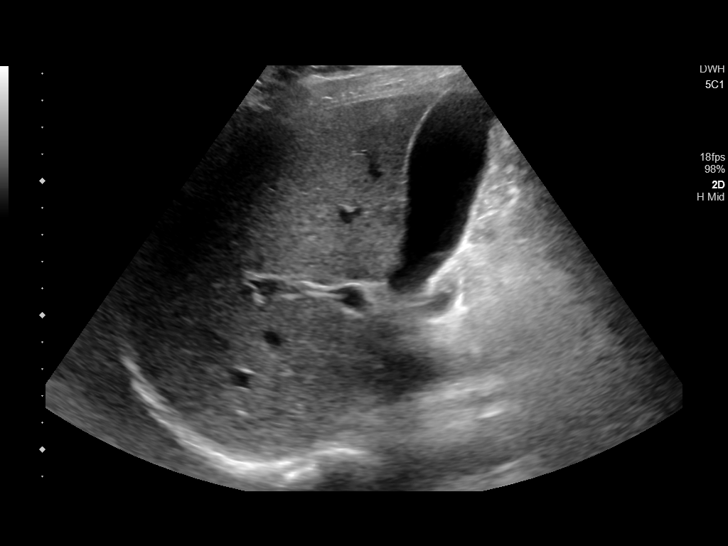
[im 39/43]
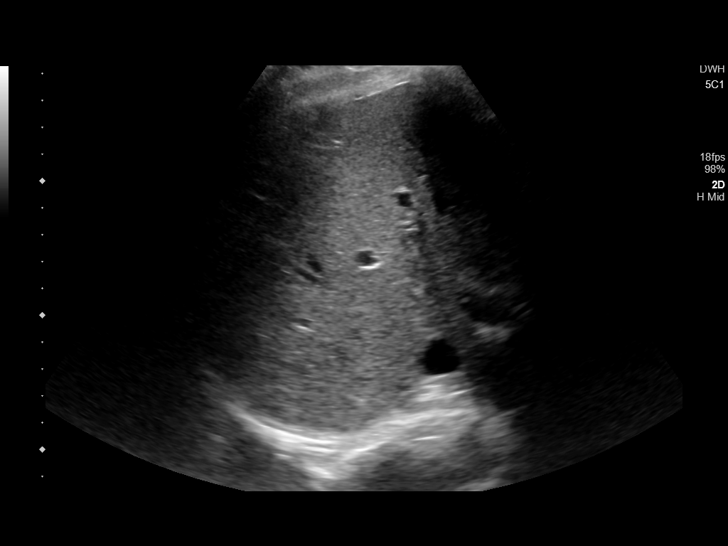
[im 43/43]
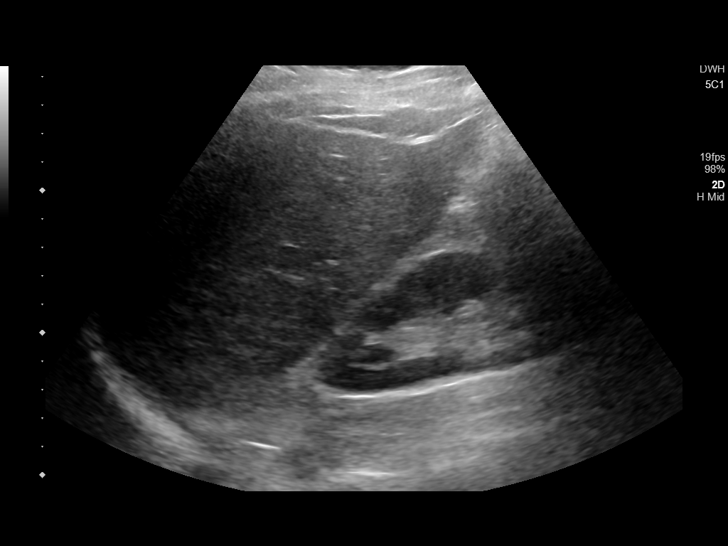

[14 of 25 positions shown; findings below may reference images not displayed]

FINDINGS: Gallbladder:

No gallstones or wall thickening visualized. No sonographic Murphy
sign noted by sonographer.

Common bile duct:

Diameter: 3.6 mm

Liver:

No focal lesion identified. Within normal limits in parenchymal
echogenicity. Portal vein is patent on color Doppler imaging with
normal direction of blood flow towards the liver.

Other: None.
IMPRESSION: Negative right upper quadrant abdominal ultrasound

## 2020-09-21 ENCOUNTER — Other Ambulatory Visit: Payer: Self-pay | Admitting: Family Medicine

## 2020-09-21 DIAGNOSIS — E669 Obesity, unspecified: Secondary | ICD-10-CM

## 2020-09-21 NOTE — Telephone Encounter (Signed)
}    Notes to clinic:  patient last seen on 03/02/2020 for weight loss No follow up on file  Review for refill    Requested Prescriptions  Pending Prescriptions Disp Refills   WEGOVY 2.4 MG/0.75ML SOAJ [Pharmacy Med Name: WEGOVY 2.4 MG/0.75 ML PEN[R]] 3 mL 1    Sig: INJECT THE CONTENTS OF ONE PEN UNDER THE SKIN ONCE WEEKLY ON THE SAME DAY EACH WEEK      Endocrinology:  Diabetes - GLP-1 Receptor Agonists Failed - 09/21/2020  1:15 PM      Failed - HBA1C is between 0 and 7.9 and within 180 days    Hgb A1c MFr Bld  Date Value Ref Range Status  01/12/2008   Final   5.3 (NOTE)   The ADA recommends the following therapeutic goal for glycemic   control related to Hgb A1C measurement:   Goal of Therapy:   < 7.0% Hgb A1C   Reference: American Diabetes Association: Clinical Practice   Recommendations 2008, Diabetes Care,  2008, 31:(Suppl 1).          Passed - Valid encounter within last 6 months    Recent Outpatient Visits           4 months ago Other migraine without status migrainosus, not intractable   [facility] [place], Ricki Rodriguez M, [place]   6 months ago Weight loss   [facility] Osvaldo Angst M, [place]   9 months ago Major depressive disorder, remission status unspecified, unspecified whether recurrent   [facility] [place], Lavella Hammock, [place]   1 year ago Tick bite, initial encounter   [facility] Osvaldo Angst M, [place]   2 years ago Menorrhagia with regular cycle   [facility] [place], [place], [place]

## 2020-10-16 ENCOUNTER — Other Ambulatory Visit (HOSPITAL_COMMUNITY): Payer: Self-pay | Admitting: Obstetrics & Gynecology

## 2020-10-16 DIAGNOSIS — R1032 Left lower quadrant pain: Secondary | ICD-10-CM

## 2020-10-17 ENCOUNTER — Other Ambulatory Visit: Payer: Self-pay | Admitting: Family Medicine

## 2020-10-17 ENCOUNTER — Telehealth: Payer: Self-pay | Admitting: Family Medicine

## 2020-10-17 DIAGNOSIS — E669 Obesity, unspecified: Secondary | ICD-10-CM

## 2020-10-17 NOTE — Telephone Encounter (Signed)
Al Decant, from [facility], called stating that the pt is on eletriptan and for insurance they are needing to know the max number of migraine episodes per month. Please advise.       [phone]

## 2020-10-17 NOTE — Telephone Encounter (Signed)
Requested medications are due for refill today yes  Requested medications are on the active medication list yes  Last refill 7/29  Last visit 12022  Future visit scheduled no  Notes to clinic Has already had a curtesy refill and there is no upcoming appointment scheduled.

## 2020-10-18 NOTE — Telephone Encounter (Signed)
Maybe she should discuss with Dr Malvin Johns. We would need to see her for visit to discuss her migraines per month to answer this question for them.

## 2020-10-19 NOTE — Telephone Encounter (Signed)
Pharmacy advised  

## 2020-10-21 ENCOUNTER — Other Ambulatory Visit: Payer: Self-pay | Admitting: Family Medicine

## 2020-10-21 DIAGNOSIS — E669 Obesity, unspecified: Secondary | ICD-10-CM
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
## 2020-10-23 NOTE — Telephone Encounter (Signed)
patient was last seen in office on 04/25/20 and medication was filled and patient advised to follow up in 3-4 months she has not done so and has no future visit scheduled,medication last refilled by Dr. Sherrie Mustache on 09/21/20 patient was advised then that she would need to schedule office visit before any further refills. KW

## 2020-10-24 ENCOUNTER — Encounter: Payer: Self-pay | Admitting: Family Medicine

## 2020-10-24 ENCOUNTER — Other Ambulatory Visit: Payer: Self-pay

## 2020-10-24 ENCOUNTER — Ambulatory Visit (INDEPENDENT_AMBULATORY_CARE_PROVIDER_SITE_OTHER): Payer: BC Managed Care – PPO | Admitting: Family Medicine

## 2020-10-24 VITALS — BP 111/70 | HR 73 | Temp 98.5°F | Resp 16 | Wt 162.2 lb

## 2020-10-24 DIAGNOSIS — S0990XA Unspecified injury of head, initial encounter: Secondary | ICD-10-CM | POA: Insufficient documentation

## 2020-10-24 DIAGNOSIS — G43809 Other migraine, not intractable, without status migrainosus: Secondary | ICD-10-CM | POA: Diagnosis not present

## 2020-10-24 DIAGNOSIS — S0990XD Unspecified injury of head, subsequent encounter: Secondary | ICD-10-CM

## 2020-10-24 DIAGNOSIS — F339 Major depressive disorder, recurrent, unspecified: Secondary | ICD-10-CM | POA: Insufficient documentation

## 2020-10-24 DIAGNOSIS — E663 Overweight: Secondary | ICD-10-CM | POA: Diagnosis not present

## 2020-10-24 DIAGNOSIS — R61 Generalized hyperhidrosis: Secondary | ICD-10-CM | POA: Diagnosis not present

## 2020-10-24 MED ORDER — ELETRIPTAN HYDROBROMIDE 40 MG PO TABS: 80.0000 mg | ORAL_TABLET | Freq: Every day | ORAL | 1 refills | Status: DC | PRN

## 2020-10-24 MED ORDER — WEGOVY 2.4 MG/0.75ML ~~LOC~~ SOAJ: 2.4000 mg | SUBCUTANEOUS | 4 refills | Status: DC

## 2020-10-24 NOTE — Assessment & Plan Note (Signed)
Stable, on medication, sees specialist No acute concerns today PDMP reviewed

## 2020-10-24 NOTE — Assessment & Plan Note (Signed)
Current BMI 27 Doing well with Rx guided weight loss Continue Rx as reaching milestones Plan for behavior modification following reaching goals to prevent regain
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
## 2020-10-24 NOTE — Assessment & Plan Note (Signed)
Do not interfere with sleep at this time Intermittent in nature Post-menopause, no period for >2 years
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
## 2020-10-25 LAB — LIPID PANEL
Chol/HDL Ratio: 3.9 ratio (ref 0.0–4.4)
Cholesterol, Total: 252 mg/dL — ABNORMAL HIGH (ref 100–199)
HDL: 64 mg/dL (ref >39)
LDL Chol Calc (NIH): 176 mg/dL — ABNORMAL HIGH (ref 0–99)
Triglycerides: 72 mg/dL (ref 0–149)
VLDL Cholesterol Cal: 12 mg/dL (ref 5–40)

## 2020-10-25 LAB — COMPREHENSIVE METABOLIC PANEL
ALT: 11 IU/L (ref 0–32)
AST: 14 IU/L (ref 0–40)
Albumin/Globulin Ratio: 2.2 (ref 1.2–2.2)
Albumin: 4.8 g/dL (ref 3.8–4.8)
Alkaline Phosphatase: 95 IU/L (ref 44–121)
BUN/Creatinine Ratio: 12 (ref 9–23)
BUN: 14 mg/dL (ref 6–24)
Bilirubin Total: 0.3 mg/dL (ref 0.0–1.2)
CO2: 25 mmol/L (ref 20–29)
Calcium: 10.1 mg/dL (ref 8.7–10.2)
Chloride: 101 mmol/L (ref 96–106)
Creatinine, Ser: 1.18 mg/dL — ABNORMAL HIGH (ref 0.57–1.00)
Globulin, Total: 2.2 g/dL (ref 1.5–4.5)
Glucose: 73 mg/dL (ref 65–99)
Potassium: 3.9 mmol/L (ref 3.5–5.2)
Sodium: 141 mmol/L (ref 134–144)
Total Protein: 7 g/dL (ref 6.0–8.5)
eGFR: 57 mL/min/{1.73_m2} — ABNORMAL LOW (ref >59)

## 2020-10-25 LAB — CBC WITH DIFFERENTIAL/PLATELET
Basophils Absolute: 0 10*3/uL (ref 0.0–0.2)
Basos: 1 %
EOS (ABSOLUTE): 0.1 10*3/uL (ref 0.0–0.4)
Eos: 2 %
Hematocrit: 40.7 % (ref 34.0–46.6)
Hemoglobin: 13.9 g/dL (ref 11.1–15.9)
Immature Grans (Abs): 0 10*3/uL (ref 0.0–0.1)
Immature Granulocytes: 0 %
Lymphocytes Absolute: 1.6 10*3/uL (ref 0.7–3.1)
Lymphs: 35 %
MCH: 29.3 pg (ref 26.6–33.0)
MCHC: 34.2 g/dL (ref 31.5–35.7)
MCV: 86 fL (ref 79–97)
Monocytes Absolute: 0.4 10*3/uL (ref 0.1–0.9)
Monocytes: 9 %
Neutrophils Absolute: 2.5 10*3/uL (ref 1.4–7.0)
Neutrophils: 53 %
Platelets: 207 10*3/uL (ref 150–450)
RBC: 4.74 x10E6/uL (ref 3.77–5.28)
RDW: 11.8 % (ref 11.7–15.4)
WBC: 4.7 10*3/uL (ref 3.4–10.8)

## 2020-10-25 LAB — MAGNESIUM: Magnesium: 2 mg/dL (ref 1.6–2.3)

## 2020-10-25 LAB — VITAMIN D 25 HYDROXY (VIT D DEFICIENCY, FRACTURES): Vit D, 25-Hydroxy: 52.8 ng/mL (ref 30.0–100.0)

## 2020-10-25 LAB — VITAMIN B12: Vitamin B-12: 337 pg/mL (ref 232–1245)

## 2020-10-25 LAB — HEMOGLOBIN A1C
Est. average glucose Bld gHb Est-mCnc: 97 mg/dL
Hgb A1c MFr Bld: 5 % (ref 4.8–5.6)

## 2020-10-25 LAB — TSH: TSH: 0.882 u[IU]/mL (ref 0.450–4.500)

## 2020-10-25 NOTE — Progress Notes (Signed)
Good morning, Amor,  Your lab results have returned.  Your cell count is normal; your kidney levels are a bit lower than we'd expect; could be minor dehydration- however, they appear elevated for the past 1.5 years.  We will continue to trend.  No signs of pre-diabetes/diabetes.  Total cholesterol and bad cholesterol are elevated. Recommend low fat/low saturated fat diet.  Also recommend increasing exercise to minimum of 150 mins/week.  The 10-year ASCVD risk score Denman George DC Montez Hageman., et al., 2013) is: 0.8%   Values used to calculate the score:     Age: 50 years     Sex: Female     Is Non-Hispanic African American: No     Diabetic: No     Tobacco smoker: No     Systolic Blood Pressure: 111 mmHg     Is BP treated: No     HDL Cholesterol: 64 mg/dL     Total Cholesterol: 252 mg/dL  Magnesium, thyroid, Vit B and D all normal and stable.  It was a pleasure meeting you.  Please let us know if you have any questions.  Thank you,  Merita Norton, FNP

## 2020-11-09 DIAGNOSIS — R519 Headache, unspecified: Secondary | ICD-10-CM | POA: Diagnosis not present

## 2020-11-09 DIAGNOSIS — M542 Cervicalgia: Secondary | ICD-10-CM | POA: Diagnosis not present

## 2020-11-09 DIAGNOSIS — H53149 Visual discomfort, unspecified: Secondary | ICD-10-CM | POA: Diagnosis not present

## 2020-11-09 DIAGNOSIS — F40298 Other specified phobia: Secondary | ICD-10-CM | POA: Diagnosis not present

## 2020-11-13 ENCOUNTER — Telehealth: Payer: Self-pay

## 2020-11-13 ENCOUNTER — Other Ambulatory Visit: Payer: Self-pay | Admitting: Family Medicine

## 2020-11-13 DIAGNOSIS — G43809 Other migraine, not intractable, without status migrainosus: Secondary | ICD-10-CM

## 2020-11-13 MED ORDER — GABAPENTIN 300 MG PO CAPS: 300.0000 mg | ORAL_CAPSULE | Freq: Three times a day (TID) | ORAL | 1 refills | Status: DC

## 2020-11-13 NOTE — Telephone Encounter (Signed)
[facility] faxed refill request for the following medications:  gabapentin (NEURONTIN) 300 MG capsule  Not on current medication list  Please advise.

## 2020-11-13 NOTE — Telephone Encounter (Signed)
Please review request for Gabapentin, patient last seen in office in August and medication was not mentioned in office note. Reviewing over past medication list medication was discontinued on 03/06/20. KW

## 2020-11-14 ENCOUNTER — Encounter: Payer: Self-pay | Admitting: Family Medicine

## 2020-11-16 ENCOUNTER — Other Ambulatory Visit: Payer: Self-pay | Admitting: Family Medicine

## 2020-11-16 DIAGNOSIS — E663 Overweight: Secondary | ICD-10-CM

## 2020-11-16 MED ORDER — TIRZEPATIDE 15 MG/0.5ML ~~LOC~~ SOAJ: 15.0000 mg | SUBCUTANEOUS | 3 refills | Status: DC

## 2020-12-12 ENCOUNTER — Other Ambulatory Visit: Payer: Self-pay | Admitting: Family Medicine

## 2020-12-12 DIAGNOSIS — G43809 Other migraine, not intractable, without status migrainosus: Secondary | ICD-10-CM

## 2020-12-12 NOTE — Telephone Encounter (Signed)
Requested Prescriptions  Pending Prescriptions Disp Refills  . eletriptan (RELPAX) 40 MG tablet [Pharmacy Med Name: ELETRIPTAN 40 MG TAB] 24 tablet 1    Sig: TAKE TWO TABLETS BY MOUTH ONE TIME DAILY AS NEEDED     Neurology:  Migraine Therapy - Triptan Passed - 12/12/2020  1:53 AM      Passed - Last BP in normal range    BP Readings from Last 1 Encounters:  10/24/20 111/70         Passed - Valid encounter within last 12 months    Recent Outpatient Visits          1 month ago Other migraine without status migrainosus, not intractable   [facility] Merita Norton T, FNP   7 months ago Other migraine without status migrainosus, not intractable   [facility] [place], Adriana M, PA-C   9 months ago Weight loss   [facility] [place], Ricki Rodriguez M, [place]   12 months ago Major depressive disorder, remission status unspecified, unspecified whether recurrent   [facility] [place], Lavella Hammock, PA-C   1 year ago Tick bite, initial encounter   [facility] [place], Lavella Hammock, [place]      Future Appointments            In 1 month Jacky Kindle, FNP [facility], [facility]

## 2020-12-13 ENCOUNTER — Telehealth: Payer: Self-pay | Admitting: Family Medicine

## 2020-12-13 NOTE — Telephone Encounter (Signed)
[facility] faxed refill request for the following medications:   gabapentin (NEURONTIN) 300 MG capsule  Please advise.

## 2020-12-28 DIAGNOSIS — H53149 Visual discomfort, unspecified: Secondary | ICD-10-CM | POA: Diagnosis not present

## 2020-12-28 DIAGNOSIS — M542 Cervicalgia: Secondary | ICD-10-CM | POA: Diagnosis not present

## 2020-12-28 DIAGNOSIS — F40298 Other specified phobia: Secondary | ICD-10-CM | POA: Diagnosis not present

## 2020-12-28 DIAGNOSIS — R519 Headache, unspecified: Secondary | ICD-10-CM | POA: Diagnosis not present

## 2021-01-01 DIAGNOSIS — F3342 Major depressive disorder, recurrent, in full remission: Secondary | ICD-10-CM | POA: Diagnosis not present

## 2021-01-01 DIAGNOSIS — G4720 Circadian rhythm sleep disorder, unspecified type: Secondary | ICD-10-CM | POA: Diagnosis not present

## 2021-01-25 ENCOUNTER — Other Ambulatory Visit: Payer: Self-pay

## 2021-01-25 ENCOUNTER — Encounter: Payer: Self-pay | Admitting: Family Medicine

## 2021-01-25 ENCOUNTER — Ambulatory Visit: Payer: BC Managed Care – PPO | Admitting: Family Medicine

## 2021-01-25 VITALS — BP 99/69 | HR 69 | Temp 97.8°F | Resp 16 | Ht 65.0 in | Wt 148.0 lb

## 2021-01-25 DIAGNOSIS — N951 Menopausal and female climacteric states: Secondary | ICD-10-CM | POA: Diagnosis not present

## 2021-01-25 DIAGNOSIS — F324 Major depressive disorder, single episode, in partial remission: Secondary | ICD-10-CM | POA: Diagnosis not present

## 2021-01-25 DIAGNOSIS — G43809 Other migraine, not intractable, without status migrainosus: Secondary | ICD-10-CM

## 2021-01-25 MED ORDER — ESTROGENS CONJUGATED 0.3 MG PO TABS
0.3000 mg | ORAL_TABLET | Freq: Every day | ORAL | 3 refills | Status: DC
Start: 2021-01-25 — End: 2021-05-01

## 2021-01-25 MED ORDER — ELETRIPTAN HYDROBROMIDE 40 MG PO TABS: 40.0000 mg | ORAL_TABLET | ORAL | 12 refills | Status: DC | PRN

## 2021-01-25 NOTE — Progress Notes (Signed)
Established patient visit   Patient: Charlene Freeman   DOB: 1970-03-23   50 y.o. Female  MRN: [id number] Visit Date: 01/25/2021  Today's healthcare provider: Jacky Kindle, FNP   Chief Complaint  Patient presents with   Depression   Anxiety   Migraine   Subjective    HPI  Depression and Anxiety, Follow-up  She  was last seen for this 3 months ago. Changes made at last visit include none; continue same medication. Since last visit patient has had some medications changed by her neurologist Dr. Malvin Johns.    She reports good compliance with treatment. She is not having side effects.   She reports good tolerance of treatment. She feels she is Improved since last visit.  Depression screen [facility] 2/9 01/25/2021 10/24/2020 12/12/2017  Decreased Interest 0 0 1  Down, Depressed, Hopeless 0 0 0  PHQ - 2 Score 0 0 1  Altered sleeping 1 0 1  Tired, decreased energy 0 0 1  Change in appetite 0 0 0  Feeling bad or failure about yourself  0 0 1  Trouble concentrating 0 0 1  Moving slowly or fidgety/restless 0 0 0  Suicidal thoughts 0 0 0  PHQ-9 Score 1 0 5  Difficult doing work/chores Not difficult at all Not difficult at all Not difficult at all    -----------------------------------------------------------------------------------------   Follow up for migraine:  The patient was last seen for this 3 months ago. Changes made at last visit include : Referral back to neurology; increase Relpax to 80 qday Discussed starting an additional prophylactic medication   She reports good compliance with treatment. She feels that condition is Improved. She is not having side effects.   -----------------------------------------------------------------------------------------  Follow up for overweight:   The patient was last seen for this 3 months ago. Changes made at last visit include none. Patient was advised to plan for behavior modification following reaching goals to prevent  regain. Since last visit Wegovy was changed to [facility].   She reports good compliance with treatment. She feels that condition is Improved. She is not having side effects.   -----------------------------------------------------------------------------------------   Medications: Outpatient Medications Prior to Visit  Medication Sig   ALPRAZolam (XANAX) 1 MG tablet as needed.   ondansetron (ZOFRAN-ODT) 8 MG disintegrating tablet Take 8 mg by mouth every 8 (eight) hours as needed for nausea or vomiting.   tirzepatide (MOUNJARO) 15 MG/0.5ML Pen Inject 15 mg into the skin once a week.   tiZANidine (ZANAFLEX) 4 MG tablet Take 4 mg by mouth 3 (three) times daily.   traZODone (DESYREL) 100 MG tablet Take 200 mg by mouth at bedtime.   venlafaxine XR (EFFEXOR-XR) 75 MG 24 hr capsule Take 225 mg by mouth daily.   [DISCONTINUED] eletriptan (RELPAX) 40 MG tablet TAKE TWO TABLETS BY MOUTH ONE TIME DAILY AS NEEDED   Insulin Pen Needle (NOVOFINE PEN NEEDLE) 32G X 6 MM MISC Use daily for saxenda injection. (Patient not taking: Reported on 01/25/2021)   [DISCONTINUED] DULoxetine (CYMBALTA) 30 MG capsule Take 30 mg by mouth 2 (two) times daily. (Patient not taking: Reported on 01/25/2021)   [DISCONTINUED] gabapentin (NEURONTIN) 300 MG capsule Take 1 capsule (300 mg total) by mouth 3 (three) times daily. (Patient not taking: Reported on 01/25/2021)   No facility-administered medications prior to visit.    Review of Systems  Constitutional:  Negative for appetite change, chills, fatigue and fever.  Respiratory:  Negative for chest tightness and shortness of  breath.   Cardiovascular:  Negative for chest pain and palpitations.  Gastrointestinal:  Negative for abdominal pain, nausea and vomiting.  Neurological:  Negative for dizziness and weakness.      Objective    BP 99/69 (BP Location: Right Arm, Patient Position: Sitting, Cuff Size: Normal)   Pulse 69   Temp 97.8 F (36.6 C) (Oral)   Resp 16   Ht  5\' 5"  (1.651 m)   Wt 148 lb (67.1 kg)   SpO2 100% Comment: room air  BMI 24.63 kg/m    Physical Exam Vitals and nursing note reviewed.  Constitutional:      General: She is not in acute distress.    Appearance: Normal appearance. She is normal weight. She is not ill-appearing, toxic-appearing or diaphoretic.  HENT:     Head: Normocephalic and atraumatic.  Cardiovascular:     Rate and Rhythm: Normal rate and regular rhythm.     Pulses: Normal pulses.     Heart sounds: Normal heart sounds. No murmur heard.   No friction rub. No gallop.  Pulmonary:     Effort: Pulmonary effort is normal. No respiratory distress.     Breath sounds: Normal breath sounds. No stridor. No wheezing, rhonchi or rales.  Chest:     Chest wall: No tenderness.  Abdominal:     General: Bowel sounds are normal.     Palpations: Abdomen is soft.  Genitourinary:    Comments: Complaints of vaginal dryness and concern for recent UTI- using AZO Musculoskeletal:        General: No swelling, tenderness, deformity or signs of injury. Normal range of motion.     Right lower leg: No edema.     Left lower leg: No edema.  Skin:    General: Skin is warm and dry.     Capillary Refill: Capillary refill takes less than 2 seconds.     Coloration: Skin is not jaundiced or pale.     Findings: No bruising, erythema, lesion or rash.  Neurological:     General: No focal deficit present.     Mental Status: She is alert and oriented to person, place, and time. Mental status is at baseline.     Cranial Nerves: No cranial nerve deficit.     Sensory: No sensory deficit.     Motor: No weakness.     Coordination: Coordination normal.  Psychiatric:        Mood and Affect: Mood normal.        Behavior: Behavior normal.        Thought Content: Thought content normal.        Judgment: Judgment normal.     No results found for any visits on 01/25/21.  Assessment & Plan     Problem List Items Addressed This Visit        Cardiovascular and Mediastinum   Migraine    Chronic, intermittent Much improved since change in dose of medication Refill request      Relevant Medications   venlafaxine XR (EFFEXOR-XR) 75 MG 24 hr capsule   eletriptan (RELPAX) 40 MG tablet     Genitourinary   Vaginal dryness, menopausal    Trial of estrogen tablets Recommendation of water based lubrication  Continue to focus on whole body for sexual arousal  Practice communication with partner regarding wants/desires      Relevant Medications   estrogens, conjugated, (PREMARIN) 0.3 MG tablet     Other   Depression, major, single episode, in partial remission (  HCC) - Primary    Change in medications; improved Denies any difficulties at present time Continue medication daily      Relevant Medications   venlafaxine XR (EFFEXOR-XR) 75 MG 24 hr capsule     Return in about 3 months (around 04/25/2021) for anxiety and depression.      Leilani Merl, FNP, have reviewed all documentation for this visit. The documentation on 01/25/21 for the exam, diagnosis, procedures, and orders are all accurate and complete.    Jacky Kindle, FNP  [facility] [phone] (phone) [phone] (fax)  [facility]

## 2021-01-25 NOTE — Assessment & Plan Note (Signed)
Change in medications; improved Denies any difficulties at present time Continue medication daily

## 2021-01-25 NOTE — Assessment & Plan Note (Signed)
Trial of estrogen tablets Recommendation of water based lubrication  Continue to focus on whole body for sexual arousal  Practice communication with partner regarding wants/desires

## 2021-01-25 NOTE — Assessment & Plan Note (Signed)
Chronic, intermittent Much improved since change in dose of medication Refill request

## 2021-02-27 DIAGNOSIS — R519 Headache, unspecified: Secondary | ICD-10-CM | POA: Diagnosis not present

## 2021-02-27 DIAGNOSIS — R200 Anesthesia of skin: Secondary | ICD-10-CM | POA: Diagnosis not present

## 2021-02-27 DIAGNOSIS — H53149 Visual discomfort, unspecified: Secondary | ICD-10-CM | POA: Diagnosis not present

## 2021-02-27 DIAGNOSIS — M542 Cervicalgia: Secondary | ICD-10-CM | POA: Diagnosis not present

## 2021-03-08 ENCOUNTER — Encounter: Payer: Self-pay | Admitting: Family Medicine

## 2021-03-09 ENCOUNTER — Other Ambulatory Visit: Payer: Self-pay | Admitting: Family Medicine

## 2021-03-09 MED ORDER — NIRMATRELVIR/RITONAVIR (PAXLOVID) TABLET (RENAL DOSING): 2.0000 | ORAL_TABLET | Freq: Two times a day (BID) | ORAL | 0 refills | Status: AC

## 2021-04-09 DIAGNOSIS — Z01419 Encounter for gynecological examination (general) (routine) without abnormal findings: Secondary | ICD-10-CM | POA: Diagnosis not present

## 2021-04-09 DIAGNOSIS — Z1231 Encounter for screening mammogram for malignant neoplasm of breast: Secondary | ICD-10-CM | POA: Diagnosis not present

## 2021-04-09 DIAGNOSIS — Z6822 Body mass index (BMI) 22.0-22.9, adult: Secondary | ICD-10-CM | POA: Diagnosis not present

## 2021-04-09 DIAGNOSIS — N9411 Superficial (introital) dyspareunia: Secondary | ICD-10-CM | POA: Diagnosis not present

## 2021-04-09 LAB — HM MAMMOGRAPHY

## 2021-04-25 ENCOUNTER — Ambulatory Visit: Payer: BC Managed Care – PPO | Admitting: Family Medicine

## 2021-04-25 NOTE — Progress Notes (Deleted)
?  ? ? ?  Established patient visit ? ? ?Patient: Charlene Freeman   DOB: [birth year]   51 y.o. Female  MRN: [id number] ?Visit Date: 04/25/2021 ? ?Today's healthcare provider: Jacky Kindle, FNP  ? ?No chief complaint on file. ? ?Subjective  ?  ?HPI  ?Depression, Follow-up ? ?She  was last seen for this 3 months ago. ?Changes made at last visit include none. ?  ?She reports {excellent/good/fair/poor:19665} compliance with treatment. ?She {is/is not:21021397} having side effects. *** ? ?She reports {DESC; GOOD/FAIR/POOR:18685} tolerance of treatment. ?Current symptoms include: {Symptoms; depression:1002} ?She feels she is {improved/worse/unchanged:3041574} since last visit. ? ?Depression screen [facility] 2/9 01/25/2021 10/24/2020 12/12/2017  ?Decreased Interest 0 0 1  ?Down, Depressed, Hopeless 0 0 0  ?PHQ - 2 Score 0 0 1  ?Altered sleeping 1 0 1  ?Tired, decreased energy 0 0 1  ?Change in appetite 0 0 0  ?Feeling bad or failure about yourself  0 0 1  ?Trouble concentrating 0 0 1  ?Moving slowly or fidgety/restless 0 0 0  ?Suicidal thoughts 0 0 0  ?PHQ-9 Score 1 0 5  ?Difficult doing work/chores Not difficult at all Not difficult at all Not difficult at all  ?  ?-----------------------------------------------------------------------------------------  ? ?Medications: ?Outpatient Medications Prior to Visit  ?Medication Sig  ? ALPRAZolam (XANAX) 1 MG tablet as needed.  ? eletriptan (RELPAX) 40 MG tablet Take 1 tablet (40 mg total) by mouth every 2 (two) hours as needed for migraine or headache. May repeat in 2 hours if headache persists or recurs.  ? estrogens, conjugated, (PREMARIN) 0.3 MG tablet Take 1 tablet (0.3 mg total) by mouth daily. 1 tablet every other day for vaginal dryness  ? Insulin Pen Needle (NOVOFINE PEN NEEDLE) 32G X 6 MM MISC Use daily for saxenda injection. (Patient not taking: Reported on 01/25/2021)  ? ondansetron (ZOFRAN-ODT) 8 MG disintegrating tablet Take 8 mg by mouth every 8 (eight) hours as needed for  nausea or vomiting.  ? tirzepatide (MOUNJARO) 15 MG/0.5ML Pen Inject 15 mg into the skin once a week.  ? tiZANidine (ZANAFLEX) 4 MG tablet Take 4 mg by mouth 3 (three) times daily.  ? traZODone (DESYREL) 100 MG tablet Take 200 mg by mouth at bedtime.  ? venlafaxine XR (EFFEXOR-XR) 75 MG 24 hr capsule Take 225 mg by mouth daily.  ? ?No facility-administered medications prior to visit.  ? ? ?Review of Systems ? ?{Labs  Heme  Chem  Endocrine  Serology  Results Review (optional):23779} ?  Objective  ?  ?There were no vitals taken for this visit. ?{Show previous vital signs (optional):23777} ? ?Physical Exam  ?*** ? ?No results found for any visits on 04/25/21. ? Assessment & Plan  ?  ? ?*** ? ?No follow-ups on file.  ?   ? ?{provider attestation***:1} ? ? ?Jacky Kindle, FNP  ?[facility] ?[phone] (phone) ?[phone] (fax) ? ?[facility] ?

## 2021-05-01 ENCOUNTER — Encounter: Payer: Self-pay | Admitting: Family Medicine

## 2021-05-01 ENCOUNTER — Ambulatory Visit: Payer: BC Managed Care – PPO | Admitting: Family Medicine

## 2021-05-01 ENCOUNTER — Other Ambulatory Visit: Payer: Self-pay

## 2021-05-01 VITALS — BP 96/62 | HR 87 | Temp 97.7°F | Resp 12 | Wt 129.0 lb

## 2021-05-01 DIAGNOSIS — G43809 Other migraine, not intractable, without status migrainosus: Secondary | ICD-10-CM | POA: Diagnosis not present

## 2021-05-01 DIAGNOSIS — F330 Major depressive disorder, recurrent, mild: Secondary | ICD-10-CM | POA: Diagnosis not present

## 2021-05-01 DIAGNOSIS — F410 Panic disorder [episodic paroxysmal anxiety] without agoraphobia: Secondary | ICD-10-CM | POA: Diagnosis not present

## 2021-05-01 MED ORDER — ELETRIPTAN HYDROBROMIDE 40 MG PO TABS: 40.0000 mg | ORAL_TABLET | ORAL | 0 refills | Status: DC | PRN

## 2021-05-01 NOTE — Assessment & Plan Note (Signed)
Occ use of benzo; written by Dr Toy Care ? ?

## 2021-05-01 NOTE — Assessment & Plan Note (Signed)
Chronic, stable ?Continue effexor ?PHQ and GAD stable ?Contracted to safety; denies SI or HI ?

## 2021-05-01 NOTE — Assessment & Plan Note (Signed)
Chronic, stable ?Has avoided all OTC medications ?Was previously taking BC powder ?Discussed rebound nature of chronic HA with use of BC ?Request quantity increase as she is quite debilitated by migraines and as a result is taking current Rx at [facility] and is afraid that she will run out early ?Has been seen by Dr Melrose Nakayama in past  ? ?

## 2021-05-01 NOTE — Progress Notes (Signed)
?  ?I,Elena D DeSanto,acting as a scribe for Jacky Kindle, FNP.,have documented all relevant documentation on the behalf of Jacky Kindle, FNP,as directed by  Jacky Kindle, FNP while in the presence of Jacky Kindle, FNP. ? ? ? ?Established patient visit ? ? ?Patient: Charlene Freeman   DOB: 23-Dec-1970   51 y.o. Female  MRN: [id number] ?Visit Date: 05/01/2021 ? ?Today's healthcare provider: Jacky Kindle, FNP  ? ?Chief Complaint  ?Patient presents with  ? Depression  ? Anxiety  ? ?Subjective  ?  ?HPI  ?Depression and Anxiety, Follow-up ? ?She was last seen for anxiety 3 months ago. ?Changes made at last visit include none. ?  ?She reports good compliance with treatment. ?She reports good tolerance of treatment. ?She is not having side effects.  ? ?She feels her anxiety is mild and Unchanged since last visit. ? ?Symptoms: ?No chest pain No difficulty concentrating  ?No dizziness No fatigue  ?No feelings of losing control No insomnia  ?No irritable No palpitations  ?No panic attacks No racing thoughts  ?No shortness of breath No sweating  ?No tremors/shakes   ? ?GAD-7 Results ?GAD-7 Generalized Anxiety Disorder Screening Tool 05/01/2021  ?1. Feeling Nervous, Anxious, or on Edge 1  ?2. Not Being Able to Stop or Control Worrying 0  ?3. Worrying Too Much About Different Things 0  ?4. Trouble Relaxing 0  ?5. Being So Restless it's Hard To Sit Still 0  ?6. Becoming Easily Annoyed or Irritable 0  ?7. Feeling Afraid As If Something Awful Might Happen 0  ?Total GAD-7 Score 1  ?Difficulty At Work, Home, or Getting  Along With Others? Not difficult at all  ? ? ?PHQ-9 Scores ?PHQ9 SCORE ONLY 05/01/2021 01/25/2021 10/24/2020  ?PHQ-9 Total Score 1 1 0  ? ? ?--------------------------------------------------------------------------------------------------- ? ? ?Medications: ?Outpatient Medications Prior to Visit  ?Medication Sig  ? ALPRAZolam (XANAX) 1 MG tablet as needed.  ? Insulin Pen Needle (NOVOFINE PEN NEEDLE) 32G X 6 MM MISC  Use daily for saxenda injection.  ? ondansetron (ZOFRAN-ODT) 8 MG disintegrating tablet Take 8 mg by mouth every 8 (eight) hours as needed for nausea or vomiting.  ? tirzepatide (MOUNJARO) 15 MG/0.5ML Pen Inject 15 mg into the skin once a week.  ? tiZANidine (ZANAFLEX) 4 MG tablet Take 4 mg by mouth 3 (three) times daily.  ? traZODone (DESYREL) 100 MG tablet Take 200 mg by mouth at bedtime.  ? venlafaxine XR (EFFEXOR-XR) 75 MG 24 hr capsule Take 225 mg by mouth daily.  ? [DISCONTINUED] eletriptan (RELPAX) 40 MG tablet Take 1 tablet (40 mg total) by mouth every 2 (two) hours as needed for migraine or headache. May repeat in 2 hours if headache persists or recurs.  ? estradiol (ESTRACE) 0.1 MG/GM vaginal cream Vaginal 3 Times a Week  ? [DISCONTINUED] estrogens, conjugated, (PREMARIN) 0.3 MG tablet Take 1 tablet (0.3 mg total) by mouth daily. 1 tablet every other day for vaginal dryness (Patient not taking: Reported on 05/01/2021)  ? ?No facility-administered medications prior to visit.  ? ? ?Review of Systems  ?Constitutional:  Negative for appetite change, chills, fatigue and fever.  ?Respiratory:  Negative for chest tightness and shortness of breath.   ?Cardiovascular:  Negative for chest pain and palpitations.  ?Gastrointestinal:  Negative for abdominal pain, nausea and vomiting.  ?Neurological:  Negative for dizziness and weakness.  ? ? ?  Objective  ?  ?BP 96/62 (BP Location: Left Arm, Patient Position: Sitting,  Cuff Size: Normal)   Pulse 87   Temp 97.7 ?F (36.5 ?C) (Oral)   Resp 12   Wt 129 lb (58.5 kg)   SpO2 100% Comment: room air  BMI 21.47 kg/m?  ? ? ?Physical Exam ?Vitals and nursing note reviewed.  ?Constitutional:   ?   General: She is not in acute distress. ?   Appearance: Normal appearance. She is normal weight. She is not ill-appearing, toxic-appearing or diaphoretic.  ?HENT:  ?   Head: Normocephalic and atraumatic.  ?Cardiovascular:  ?   Rate and Rhythm: Normal rate and regular rhythm.  ?    Pulses: Normal pulses.  ?   Heart sounds: Normal heart sounds. No murmur heard. ?  No friction rub. No gallop.  ?Pulmonary:  ?   Effort: Pulmonary effort is normal. No respiratory distress.  ?   Breath sounds: Normal breath sounds. No stridor. No wheezing, rhonchi or rales.  ?Chest:  ?   Chest wall: No tenderness.  ?Abdominal:  ?   General: Bowel sounds are normal.  ?   Palpations: Abdomen is soft.  ?Musculoskeletal:     ?   General: No swelling, tenderness, deformity or signs of injury. Normal range of motion.  ?   Right lower leg: No edema.  ?   Left lower leg: No edema.  ?Skin: ?   General: Skin is warm and dry.  ?   Capillary Refill: Capillary refill takes less than 2 seconds.  ?   Coloration: Skin is not jaundiced or pale.  ?   Findings: No bruising, erythema, lesion or rash.  ?Neurological:  ?   General: No focal deficit present.  ?   Mental Status: She is alert and oriented to person, place, and time. Mental status is at baseline.  ?   Cranial Nerves: No cranial nerve deficit.  ?   Sensory: No sensory deficit.  ?   Motor: No weakness.  ?   Coordination: Coordination normal.  ?Psychiatric:     ?   Mood and Affect: Mood normal.     ?   Behavior: Behavior normal.     ?   Thought Content: Thought content normal.     ?   Judgment: Judgment normal.  ?  ? ?No results found for any visits on 05/01/21. ? Assessment & Plan  ?  ? ?Problem List Items Addressed This Visit   ? ?  ? Cardiovascular and Mediastinum  ? Migraine - Primary  ?  Chronic, stable ?Has avoided all OTC medications ?Was previously taking BC powder ?Discussed rebound nature of chronic HA with use of BC ?Request quantity increase as she is quite debilitated by migraines and as a result is taking current Rx at [facility] and is afraid that she will run out early ?Has been seen by Dr Malvin Johns in past  ? ?  ?  ? Relevant Medications  ? eletriptan (RELPAX) 40 MG tablet  ?  ? Other  ? Mild episode of recurrent major depressive disorder (HCC)  ?  Chronic,  stable ?Continue effexor ?PHQ and GAD stable ?Contracted to safety; denies SI or HI ?  ?  ? Panic attacks  ?  Occ use of benzo; written by Dr Evelene Croon ? ?  ?  ? ? ? ?Return in about 5 months (around 10/01/2021) for annual examination.  ?   ? ?I, Jacky Kindle, FNP, have reviewed all documentation for this visit. The documentation on 05/01/21 for the exam, diagnosis, procedures, and  orders are all accurate and complete. ? ? ? ?Jacky Kindle, FNP  ?[facility] ?[phone] (phone) ?[phone] (fax) ? ?[facility] ?

## 2021-05-24 ENCOUNTER — Other Ambulatory Visit: Payer: Self-pay | Admitting: Family Medicine

## 2021-05-24 DIAGNOSIS — G43809 Other migraine, not intractable, without status migrainosus: Secondary | ICD-10-CM

## 2021-05-25 MED ORDER — ELETRIPTAN HYDROBROMIDE 40 MG PO TABS: 40.0000 mg | ORAL_TABLET | ORAL | 0 refills | Status: DC | PRN

## 2021-06-22 DIAGNOSIS — F411 Generalized anxiety disorder: Secondary | ICD-10-CM | POA: Diagnosis not present

## 2021-06-22 DIAGNOSIS — F5101 Primary insomnia: Secondary | ICD-10-CM | POA: Diagnosis not present

## 2021-06-22 DIAGNOSIS — F332 Major depressive disorder, recurrent severe without psychotic features: Secondary | ICD-10-CM | POA: Diagnosis not present

## 2021-07-24 ENCOUNTER — Encounter: Payer: Self-pay | Admitting: Family Medicine

## 2021-09-25 ENCOUNTER — Encounter: Payer: Self-pay | Admitting: Family Medicine

## 2021-09-25 ENCOUNTER — Ambulatory Visit: Payer: BC Managed Care – PPO | Admitting: Family Medicine

## 2021-09-25 VITALS — BP 90/62 | HR 101 | Resp 16 | Ht 65.0 in | Wt 121.6 lb

## 2021-09-25 DIAGNOSIS — K219 Gastro-esophageal reflux disease without esophagitis: Secondary | ICD-10-CM | POA: Diagnosis not present

## 2021-09-25 DIAGNOSIS — R300 Dysuria: Secondary | ICD-10-CM | POA: Diagnosis not present

## 2021-09-25 MED ORDER — FAMOTIDINE 20 MG PO TABS: 20.0000 mg | ORAL_TABLET | Freq: Every day | ORAL | 0 refills | Status: DC

## 2021-09-25 MED ORDER — NITROFURANTOIN MONOHYD MACRO 100 MG PO CAPS: 100.0000 mg | ORAL_CAPSULE | Freq: Two times a day (BID) | ORAL | 0 refills | Status: AC

## 2021-09-25 NOTE — Progress Notes (Signed)
    SUBJECTIVE:   CHIEF COMPLAINT / HPI:   ABDOMINAL PAIN   Duration:months, worse over the past week Quality: dull ache Location:  LUQ  Radiation: yes, to back and epigastric Alleviating factors: nothing Aggravating factors: [place] food, soda Status: worse Treatments attempted: tylenol, heat Fever: no Nausea: yes Vomiting: no Weight loss: no Decreased appetite: yes Diarrhea: no Constipation: no Blood in stool: no Heartburn: yes Jaundice: no Rash: no Recurrent NSAID use: no Denies cough, SOB, recent illnesses  URINARY SYMPTOMS  Dysuria: yes, few days Urinary frequency: yes Urgency: yes Foul odor: yes Hematuria: no Abdominal pain: see above Back pain: no Suprapubic pain/pressure: yes Flank pain: no Fever:  no Vomiting: no Relief with pyridium: yes Previous urinary tract infection: yes Recurrent urinary tract infection: no Sexual activity: monogamous Vaginal discharge: no Treatments attempted: pyridium    OBJECTIVE:   BP 90/62 (BP Location: Left Arm, Patient Position: Sitting, Cuff Size: Normal)   Pulse (!) 101   Resp 16   Ht 5\' 5"  (1.651 m)   Wt 121 lb 9.6 oz (55.2 kg)   BMI 20.24 kg/m   Gen: well appearing, in NAD Card: RRR Lungs: CTAB Abd: soft, TTP epigastrically. +BS. Non distended. Ext: WWP, no edema   ASSESSMENT/PLAN:   GERD (gastroesophageal reflux disease) Symptoms consistent with acid reflux. Counseled on avoiding triggering foods, smaller portions. Will trial pepcid.     Dysuria Unable to perform dipstick due to pyridium use. Will send for culture. Given worsened symptoms, will provide antibiotic and adjust treatment as directed by culture.   , DO

## 2021-09-25 NOTE — Assessment & Plan Note (Signed)
Symptoms consistent with acid reflux. Counseled on avoiding triggering foods, smaller portions. Will trial pepcid.

## 2021-09-29 LAB — URINE CULTURE

## 2021-09-29 LAB — SPECIMEN STATUS REPORT

## 2021-10-01 ENCOUNTER — Ambulatory Visit (INDEPENDENT_AMBULATORY_CARE_PROVIDER_SITE_OTHER): Payer: BC Managed Care – PPO | Admitting: Family Medicine

## 2021-10-01 ENCOUNTER — Encounter: Payer: Self-pay | Admitting: Family Medicine

## 2021-10-01 VITALS — Temp 98.1°F | Resp 16 | Ht 65.0 in | Wt 126.0 lb

## 2021-10-01 DIAGNOSIS — Z114 Encounter for screening for human immunodeficiency virus [HIV]: Secondary | ICD-10-CM | POA: Diagnosis not present

## 2021-10-01 DIAGNOSIS — Z1159 Encounter for screening for other viral diseases: Secondary | ICD-10-CM | POA: Diagnosis not present

## 2021-10-01 DIAGNOSIS — G43809 Other migraine, not intractable, without status migrainosus: Secondary | ICD-10-CM | POA: Diagnosis not present

## 2021-10-01 DIAGNOSIS — Z0000 Encounter for general adult medical examination without abnormal findings: Secondary | ICD-10-CM | POA: Diagnosis not present

## 2021-10-01 MED ORDER — ELETRIPTAN HYDROBROMIDE 40 MG PO TABS: 40.0000 mg | ORAL_TABLET | ORAL | 0 refills | Status: DC | PRN

## 2021-10-01 NOTE — Assessment & Plan Note (Signed)
Chronic, stable Request for refills F/b Malvin Johns MD neuro

## 2021-10-01 NOTE — Assessment & Plan Note (Signed)
Low risk screen Treatable, and curable. If left untreated Hep C can lead to cirrhosis and liver failure. Encourage routine testing; recommend repeat testing if risk factors change.  

## 2021-10-01 NOTE — Progress Notes (Signed)
Complete physical exam   Patient: Charlene Freeman   DOB: 02-Jan-1971   51 y.o. Female  MRN: [id number] Visit Date: 10/01/2021  Today's healthcare provider: Gwyneth Sprout, FNP  Introduced to nurse practitioner role and practice setting.  All questions answered.  Discussed provider/patient relationship and expectations.  I,Tiffany J Bragg,acting as a scribe for Gwyneth Sprout, FNP.,have documented all relevant documentation on the behalf of Gwyneth Sprout, FNP,as directed by  Gwyneth Sprout, FNP while in the presence of Gwyneth Sprout, FNP.   Chief Complaint  Patient presents with   Annual Exam   Subjective    Charlene Freeman is a 51 y.o. female who presents today for a complete physical exam.  She reports consuming a general diet.  Exercise is walking.   She generally feels well. She reports sleeping well. She does not have additional problems to discuss today.  HPI    Past Medical History:  Diagnosis Date   Anxiety    Depression    Headache(784.0)    Migraine    Migraine    Past Surgical History:  Procedure Laterality Date   APPENDECTOMY     Social History   Socioeconomic History   Marital status: Married    Spouse name: Not on file   Number of children: Not on file   Years of education: Not on file   Highest education level: Not on file  Occupational History   Not on file  Tobacco Use   Smoking status: Never   Smokeless tobacco: Never  Substance and Sexual Activity   Alcohol use: No   Drug use: No   Sexual activity: Not on file  Other Topics Concern   Not on file  Social History Narrative   ** Merged History Encounter **       Social Determinants of Health   Financial Resource Strain: Not on file  Food Insecurity: Not on file  Transportation Needs: Not on file  Physical Activity: Not on file  Stress: Not on file  Social Connections: Not on file  Intimate Partner Violence: Not on file   Family Status  Relation Name Status   Other  (Not  Specified)   Mother  Alive   Father  Alive   Sister  Alive   Family History  Problem Relation Age of Onset   Migraines Other    Glaucoma Other    No Known Allergies  Patient Care Team: Gwyneth Sprout, FNP as PCP - General (Family Medicine) Carol Ada, MD (Family Medicine)   Medications: Outpatient Medications Prior to Visit  Medication Sig   ALPRAZolam (XANAX) 1 MG tablet as needed.   estradiol (ESTRACE) 0.1 MG/GM vaginal cream Vaginal 3 Times a Week   famotidine (PEPCID) 20 MG tablet Take 1 tablet (20 mg total) by mouth daily.   ondansetron (ZOFRAN-ODT) 8 MG disintegrating tablet Take 8 mg by mouth every 8 (eight) hours as needed for nausea or vomiting.   tiZANidine (ZANAFLEX) 4 MG tablet Take 4 mg by mouth 3 (three) times daily.   traZODone (DESYREL) 100 MG tablet Take 200 mg by mouth at bedtime.   venlafaxine XR (EFFEXOR-XR) 75 MG 24 hr capsule Take 225 mg by mouth daily.   [DISCONTINUED] eletriptan (RELPAX) 40 MG tablet Take 1 tablet (40 mg total) by mouth every 2 (two) hours as needed for migraine or headache. May repeat in 2 hours if headache persists or recurs.   [DISCONTINUED] Insulin Pen Needle (NOVOFINE  PEN NEEDLE) 32G X 6 MM MISC Use daily for saxenda injection.   tirzepatide (MOUNJARO) 15 MG/0.5ML Pen Inject 15 mg into the skin once a week. (Patient not taking: Reported on 10/01/2021)   No facility-administered medications prior to visit.    Review of Systems  Last CBC Lab Results  Component Value Date   WBC 4.7 10/24/2020   HGB 13.9 10/24/2020   HCT 40.7 10/24/2020   MCV 86 10/24/2020   MCH 29.3 10/24/2020   RDW 11.8 10/24/2020   PLT 207 92/42/6834   Last metabolic panel Lab Results  Component Value Date   GLUCOSE 73 10/24/2020   NA 141 10/24/2020   K 3.9 10/24/2020   CL 101 10/24/2020   CO2 25 10/24/2020   BUN 14 10/24/2020   CREATININE 1.18 (H) 10/24/2020   EGFR 57 (L) 10/24/2020   CALCIUM 10.1 10/24/2020   PROT 7.0 10/24/2020   ALBUMIN 4.8  10/24/2020   LABGLOB 2.2 10/24/2020   AGRATIO 2.2 10/24/2020   BILITOT 0.3 10/24/2020   ALKPHOS 95 10/24/2020   AST 14 10/24/2020   ALT 11 10/24/2020   ANIONGAP 4 (L) 04/27/2019   Last lipids Lab Results  Component Value Date   CHOL 252 (H) 10/24/2020   HDL 64 10/24/2020   LDLCALC 176 (H) 10/24/2020   TRIG 72 10/24/2020   CHOLHDL 3.9 10/24/2020   Last hemoglobin A1c Lab Results  Component Value Date   HGBA1C 5.0 10/24/2020   Last thyroid functions Lab Results  Component Value Date   TSH 0.882 10/24/2020   Last vitamin D Lab Results  Component Value Date   VD25OH 52.8 10/24/2020   Last vitamin B12 and Folate Lab Results  Component Value Date   VITAMINB12 337 10/24/2020      Objective     Temp 98.1 F (36.7 C) (Oral)   Resp 16   Ht $R'5\' 5"'QU$  (1.651 m)   Wt 126 lb (57.2 kg)   SpO2 100%   BMI 20.97 kg/m   BP Readings from Last 3 Encounters:  09/25/21 90/62  05/01/21 96/62  01/25/21 99/69   Wt Readings from Last 3 Encounters:  10/01/21 126 lb (57.2 kg)  09/25/21 121 lb 9.6 oz (55.2 kg)  05/01/21 129 lb (58.5 kg)   SpO2 Readings from Last 3 Encounters:  10/01/21 100%  05/01/21 100%  01/25/21 100%   Physical Exam Vitals and nursing note reviewed.  Constitutional:      General: She is awake. She is not in acute distress.    Appearance: Normal appearance. She is well-developed, well-groomed and normal weight. She is not ill-appearing, toxic-appearing or diaphoretic.  HENT:     Head: Normocephalic and atraumatic.     Jaw: There is normal jaw occlusion. No trismus, tenderness, swelling or pain on movement.     Right Ear: Hearing, tympanic membrane, ear canal and external ear normal. There is no impacted cerumen.     Left Ear: Hearing, tympanic membrane, ear canal and external ear normal. There is no impacted cerumen.     Nose: Nose normal. No congestion or rhinorrhea.     Right Turbinates: Not enlarged, swollen or pale.     Left Turbinates: Not enlarged,  swollen or pale.     Right Sinus: No maxillary sinus tenderness or frontal sinus tenderness.     Left Sinus: No maxillary sinus tenderness or frontal sinus tenderness.     Mouth/Throat:     Lips: Pink.     Mouth: Mucous membranes are  moist. No injury.     Tongue: No lesions.     Pharynx: Oropharynx is clear. Uvula midline. No pharyngeal swelling, oropharyngeal exudate, posterior oropharyngeal erythema or uvula swelling.     Tonsils: No tonsillar exudate or tonsillar abscesses.  Eyes:     General: Lids are normal. Lids are everted, no foreign bodies appreciated. Vision grossly intact. Gaze aligned appropriately. No allergic shiner or visual field deficit.       Right eye: No discharge.        Left eye: No discharge.     Extraocular Movements: Extraocular movements intact.     Conjunctiva/sclera: Conjunctivae normal.     Right eye: Right conjunctiva is not injected. No exudate.    Left eye: Left conjunctiva is not injected. No exudate.    Pupils: Pupils are equal, round, and reactive to light.  Neck:     Thyroid: No thyroid mass, thyromegaly or thyroid tenderness.     Vascular: No carotid bruit.     Trachea: Trachea normal.  Cardiovascular:     Rate and Rhythm: Normal rate and regular rhythm.     Pulses: Normal pulses.          Carotid pulses are 2+ on the right side and 2+ on the left side.      Radial pulses are 2+ on the right side and 2+ on the left side.       Dorsalis pedis pulses are 2+ on the right side and 2+ on the left side.       Posterior tibial pulses are 2+ on the right side and 2+ on the left side.     Heart sounds: Normal heart sounds, S1 normal and S2 normal. No murmur heard.    No friction rub. No gallop.  Pulmonary:     Effort: Pulmonary effort is normal. No respiratory distress.     Breath sounds: Normal breath sounds and air entry. No stridor. No wheezing, rhonchi or rales.  Chest:     Chest wall: No tenderness.     Comments: UTD on mammograms; completed at  [facility] Dr Juliene Pina Breasts: risk and benefit of breast self-exam was discussed, not examined, patient declines to have breast exam  Abdominal:     General: Abdomen is flat. Bowel sounds are normal. There is no distension.     Palpations: Abdomen is soft. There is no mass.     Tenderness: There is no abdominal tenderness. There is no right CVA tenderness, left CVA tenderness, guarding or rebound.     Hernia: No hernia is present.  Genitourinary:    Comments: Exam deferred; denies complaints Musculoskeletal:        General: No swelling, tenderness, deformity or signs of injury. Normal range of motion.     Cervical back: Full passive range of motion without pain, normal range of motion and neck supple. No edema, rigidity or tenderness. No muscular tenderness.     Right lower leg: No edema.     Left lower leg: No edema.  Lymphadenopathy:     Cervical: No cervical adenopathy.     Right cervical: No superficial, deep or posterior cervical adenopathy.    Left cervical: No superficial, deep or posterior cervical adenopathy.  Skin:    General: Skin is warm and dry.     Capillary Refill: Capillary refill takes less than 2 seconds.     Coloration: Skin is not jaundiced or pale.     Findings: No bruising, erythema, lesion or rash.  Neurological:     General: No focal deficit present.     Mental Status: She is alert and oriented to person, place, and time. Mental status is at baseline.     GCS: GCS eye subscore is 4. GCS verbal subscore is 5. GCS motor subscore is 6.     Sensory: Sensation is intact. No sensory deficit.     Motor: Motor function is intact. No weakness.     Coordination: Coordination is intact. Coordination normal.     Gait: Gait is intact. Gait normal.  Psychiatric:        Attention and Perception: Attention and perception normal.        Mood and Affect: Mood and affect normal.        Speech: Speech normal.        Behavior: Behavior normal. Behavior is cooperative.         Thought Content: Thought content normal.        Cognition and Memory: Cognition and memory normal.        Judgment: Judgment normal.     Last depression screening scores    10/01/2021    3:07 PM 09/25/2021   11:56 AM 05/01/2021    3:14 PM  PHQ 2/9 Scores  PHQ - 2 Score 0 0 0  PHQ- 9 Score $Remov'1 2 1   'CLuGlJ$ Last fall risk screening    10/01/2021    3:07 PM  [place] in the past year? 0  Number falls in past yr: 0  Injury with Fall? 0   Last Audit-C alcohol use screening    10/01/2021    3:07 PM  Alcohol Use Disorder Test (AUDIT)  1. How often do you have a drink containing alcohol? 0  2. How many drinks containing alcohol do you have on a typical day when you are drinking? 0  3. How often do you have six or more drinks on one occasion? 0  AUDIT-C Score 0   A score of 3 or more in women, and 4 or more in men indicates increased risk for alcohol abuse, EXCEPT if all of the points are from question 1   No results found for any visits on 10/01/21.  Assessment & Plan    Routine Health Maintenance and Physical Exam  Exercise Activities and Dietary recommendations  Goals   None     Immunization History  Administered Date(s) Administered   PFIZER(Purple Top)SARS-COV-2 Vaccination 02/23/2019, 03/19/2019    Health Maintenance  Topic Date Due   Hepatitis C Screening  Never done   TETANUS/TDAP  Never done   COLONOSCOPY (Pts 45-32yrs Insurance coverage will need to be confirmed)  Never done   COVID-19 Vaccine (3 - Pfizer series) 05/14/2019   PAP SMEAR-Modifier  11/16/2022   HIV Screening  Completed   HPV VACCINES  Aged Out   INFLUENZA VACCINE  Discontinued    Discussed health benefits of physical activity, and encouraged her to engage in regular exercise appropriate for her age and condition.  Problem List Items Addressed This Visit       Cardiovascular and Mediastinum   Migraine    Chronic, stable Request for refills F/b Potter MD neuro      Relevant Medications    eletriptan (RELPAX) 40 MG tablet     Other   Annual physical exam - Primary    UTD on vision UTD on dental Reports UTD on colon cancer screening- seen at [facility], ? Provider- will check records  UTD on mammo/PAP- seen at [facility] to do to keep yourself healthy  - Exercise at least 30-45 minutes a day, 3-4 days a week.  - Eat a low-fat diet with lots of fruits and vegetables, up to 7-9 servings per day.  - Seatbelts can save your life. Wear them always.  - Smoke detectors on every level of your home, check batteries every year.  - Eye Doctor - have an eye exam every 1-2 years  - Safe sex - if you may be exposed to STDs, use a condom.  - Alcohol -  If you drink, do it moderately, less than 2 drinks per day.  - [place]. Choose someone to speak for you if you are not able.  - Depression is common in our stressful world.If you're feeling down or losing interest in things you normally enjoy, please come in for a visit.  - Violence - If anyone is threatening or hurting you, please call immediately.        Relevant Orders   Comprehensive metabolic panel   CBC with Differential/Platelet   Lipid panel   TSH + free T4   Encounter for hepatitis C screening test for low risk patient    Low risk screen Treatable, and curable. If left untreated Hep C can lead to cirrhosis and liver failure. Encourage routine testing; recommend repeat testing if risk factors change.       Relevant Orders   Hepatitis C Antibody   Encounter for screening for HIV    Low risk screen Consented; encouraged to "know your status" Recommend repeat screen if risk factors change       Relevant Orders   HIV antibody (with reflex)     Return in about 1 year (around 10/02/2022) for annual examination.     Vonna Kotyk, FNP, have reviewed all documentation for this visit. The documentation on 10/01/21 for the exam, diagnosis, procedures, and orders are all accurate  and complete.    Gwyneth Sprout, [place] [phone] (phone) [phone] (fax)  [place]

## 2021-10-01 NOTE — Assessment & Plan Note (Signed)
Low risk screen ?Consented; encouraged to "know your status" ?Recommend repeat screen if risk factors change ? ?

## 2021-10-01 NOTE — Assessment & Plan Note (Signed)
UTD on vision UTD on dental Reports UTD on colon cancer screening- seen at [facility], ? Provider- will check records  UTD on mammo/PAP- seen at [facility] Dr Juliene Pina  Things to do to keep yourself healthy  - Exercise at least 30-45 minutes a day, 3-4 days a week.  - Eat a low-fat diet with lots of fruits and vegetables, up to 7-9 servings per day.  - Seatbelts can save your life. Wear them always.  - Smoke detectors on every level of your home, check batteries every year.  - Eye Doctor - have an eye exam every 1-2 years  - Safe sex - if you may be exposed to STDs, use a condom.  - Alcohol -  If you drink, do it moderately, less than 2 drinks per day.  - Health Care Power of Attorney. Choose someone to speak for you if you are not able.  - Depression is common in our stressful world.If you're feeling down or losing interest in things you normally enjoy, please come in for a visit.  - Violence - If anyone is threatening or hurting you, please call immediately.

## 2021-10-02 LAB — LIPID PANEL
Chol/HDL Ratio: 2.9 ratio (ref 0.0–4.4)
Cholesterol, Total: 196 mg/dL (ref 100–199)
HDL: 67 mg/dL (ref >39)
LDL Chol Calc (NIH): 119 mg/dL — ABNORMAL HIGH (ref 0–99)
Triglycerides: 52 mg/dL (ref 0–149)
VLDL Cholesterol Cal: 10 mg/dL (ref 5–40)

## 2021-10-02 LAB — CBC WITH DIFFERENTIAL/PLATELET
Basophils Absolute: 0 10*3/uL (ref 0.0–0.2)
Basos: 1 %
EOS (ABSOLUTE): 0.1 10*3/uL (ref 0.0–0.4)
Eos: 1 %
Hematocrit: 34.8 % (ref 34.0–46.6)
Hemoglobin: 11.9 g/dL (ref 11.1–15.9)
Immature Grans (Abs): 0 10*3/uL (ref 0.0–0.1)
Immature Granulocytes: 0 %
Lymphocytes Absolute: 1.7 10*3/uL (ref 0.7–3.1)
Lymphs: 34 %
MCH: 29.1 pg (ref 26.6–33.0)
MCHC: 34.2 g/dL (ref 31.5–35.7)
MCV: 85 fL (ref 79–97)
Monocytes Absolute: 0.4 10*3/uL (ref 0.1–0.9)
Monocytes: 8 %
Neutrophils Absolute: 2.8 10*3/uL (ref 1.4–7.0)
Neutrophils: 56 %
Platelets: 167 10*3/uL (ref 150–450)
RBC: 4.09 x10E6/uL (ref 3.77–5.28)
RDW: 11.2 % — ABNORMAL LOW (ref 11.7–15.4)
WBC: 5 10*3/uL (ref 3.4–10.8)

## 2021-10-02 LAB — COMPREHENSIVE METABOLIC PANEL
ALT: 10 IU/L (ref 0–32)
AST: 12 IU/L (ref 0–40)
Albumin/Globulin Ratio: 2.4 — ABNORMAL HIGH (ref 1.2–2.2)
Albumin: 4.5 g/dL (ref 3.9–4.9)
Alkaline Phosphatase: 73 IU/L (ref 44–121)
BUN/Creatinine Ratio: 13 (ref 9–23)
BUN: 12 mg/dL (ref 6–24)
Bilirubin Total: 0.2 mg/dL (ref 0.0–1.2)
CO2: 26 mmol/L (ref 20–29)
Calcium: 9.2 mg/dL (ref 8.7–10.2)
Chloride: 104 mmol/L (ref 96–106)
Creatinine, Ser: 0.93 mg/dL (ref 0.57–1.00)
Globulin, Total: 1.9 g/dL (ref 1.5–4.5)
Glucose: 78 mg/dL (ref 70–99)
Potassium: 4.1 mmol/L (ref 3.5–5.2)
Sodium: 143 mmol/L (ref 134–144)
Total Protein: 6.4 g/dL (ref 6.0–8.5)
eGFR: 76 mL/min/{1.73_m2} (ref >59)

## 2021-10-02 LAB — TSH+FREE T4
Free T4: 1.13 ng/dL (ref 0.82–1.77)
TSH: 1.08 u[IU]/mL (ref 0.450–4.500)

## 2021-10-02 LAB — HIV ANTIBODY (ROUTINE TESTING W REFLEX): HIV Screen 4th Generation wRfx: NONREACTIVE

## 2021-10-02 NOTE — Progress Notes (Signed)
Improved creatinine. Improved kidney function. Reassuring.  Improved cholesterol. LDL remains elevated. The 10-year ASCVD risk score (Arnett DK, et al., 2019) is: 0.4%   Values used to calculate the score:     Age: 51 years     Sex: Female     Is Non-Hispanic African American: No     Diabetic: No     Tobacco smoker: No     Systolic Blood Pressure: 90 mmHg     Is BP treated: No     HDL Cholesterol: 67 mg/dL     Total Cholesterol: 196 mg/dL Heart attack and stroke risk is 0.4% estimated within the next 10 years which is very low.   All other labs normal and stable. Please let us know if you have any questions.  Thank you, Jacky Kindle, FNP  [facility] [address] [place], [place] [address] [phone] (phone) [phone] (fax) [facility]

## 2021-10-19 ENCOUNTER — Other Ambulatory Visit: Payer: Self-pay | Admitting: Family Medicine

## 2021-10-19 MED ORDER — VENLAFAXINE HCL ER 37.5 MG PO CP24: 112.5000 mg | ORAL_CAPSULE | Freq: Every day | ORAL | 1 refills | Status: DC

## 2021-10-30 ENCOUNTER — Other Ambulatory Visit: Payer: Self-pay | Admitting: Family Medicine

## 2021-10-30 DIAGNOSIS — G43809 Other migraine, not intractable, without status migrainosus: Secondary | ICD-10-CM

## 2021-12-18 DIAGNOSIS — H524 Presbyopia: Secondary | ICD-10-CM | POA: Diagnosis not present

## 2022-01-09 DIAGNOSIS — R200 Anesthesia of skin: Secondary | ICD-10-CM | POA: Diagnosis not present

## 2022-01-09 DIAGNOSIS — R519 Headache, unspecified: Secondary | ICD-10-CM | POA: Diagnosis not present

## 2022-01-09 DIAGNOSIS — M542 Cervicalgia: Secondary | ICD-10-CM | POA: Diagnosis not present

## 2022-01-09 DIAGNOSIS — H53149 Visual discomfort, unspecified: Secondary | ICD-10-CM | POA: Diagnosis not present

## 2022-01-26 ENCOUNTER — Other Ambulatory Visit: Payer: Self-pay | Admitting: Family Medicine

## 2022-01-26 DIAGNOSIS — G43809 Other migraine, not intractable, without status migrainosus: Secondary | ICD-10-CM

## 2022-01-29 MED ORDER — ELETRIPTAN HYDROBROMIDE 40 MG PO TABS: ORAL_TABLET | ORAL | 0 refills | Status: DC

## 2022-02-07 DIAGNOSIS — R300 Dysuria: Secondary | ICD-10-CM | POA: Diagnosis not present

## 2022-02-07 DIAGNOSIS — Z113 Encounter for screening for infections with a predominantly sexual mode of transmission: Secondary | ICD-10-CM | POA: Diagnosis not present

## 2022-02-07 DIAGNOSIS — R399 Unspecified symptoms and signs involving the genitourinary system: Secondary | ICD-10-CM | POA: Diagnosis not present

## 2022-02-07 DIAGNOSIS — Z1159 Encounter for screening for other viral diseases: Secondary | ICD-10-CM | POA: Diagnosis not present

## 2022-02-07 DIAGNOSIS — Z114 Encounter for screening for human immunodeficiency virus [HIV]: Secondary | ICD-10-CM | POA: Diagnosis not present

## 2022-02-07 DIAGNOSIS — Z118 Encounter for screening for other infectious and parasitic diseases: Secondary | ICD-10-CM | POA: Diagnosis not present

## 2022-02-19 ENCOUNTER — Other Ambulatory Visit: Payer: Self-pay | Admitting: Family Medicine

## 2022-02-19 DIAGNOSIS — G43809 Other migraine, not intractable, without status migrainosus: Secondary | ICD-10-CM

## 2022-04-15 DIAGNOSIS — Z8679 Personal history of other diseases of the circulatory system: Secondary | ICD-10-CM | POA: Diagnosis not present

## 2022-04-15 DIAGNOSIS — F40298 Other specified phobia: Secondary | ICD-10-CM | POA: Diagnosis not present

## 2022-04-15 DIAGNOSIS — R519 Headache, unspecified: Secondary | ICD-10-CM | POA: Diagnosis not present

## 2022-04-15 DIAGNOSIS — H53149 Visual discomfort, unspecified: Secondary | ICD-10-CM | POA: Diagnosis not present

## 2022-05-08 ENCOUNTER — Other Ambulatory Visit: Payer: Self-pay | Admitting: Family Medicine

## 2022-05-08 DIAGNOSIS — G4720 Circadian rhythm sleep disorder, unspecified type: Secondary | ICD-10-CM | POA: Diagnosis not present

## 2022-05-08 DIAGNOSIS — F411 Generalized anxiety disorder: Secondary | ICD-10-CM | POA: Diagnosis not present

## 2022-05-08 DIAGNOSIS — F3342 Major depressive disorder, recurrent, in full remission: Secondary | ICD-10-CM | POA: Diagnosis not present

## 2022-05-08 DIAGNOSIS — G43809 Other migraine, not intractable, without status migrainosus: Secondary | ICD-10-CM

## 2022-05-09 ENCOUNTER — Other Ambulatory Visit: Payer: Self-pay | Admitting: Family Medicine

## 2022-05-09 DIAGNOSIS — G43809 Other migraine, not intractable, without status migrainosus: Secondary | ICD-10-CM

## 2022-05-09 NOTE — Telephone Encounter (Signed)
Requested Prescriptions  Pending Prescriptions Disp Refills   eletriptan (RELPAX) 40 MG tablet [Pharmacy Med Name: ELETRIPTAN 40 MG TAB] 30 tablet 0    Sig: TAKE ONE TABLET BY MOUTH AT ONSET OF HEADACHE. MAY REPEAT DOSE WITH ONE TABLET IN TWO HOURS IF NEEDED. DO NOT EXCEED TWO TABLETS IN 24 HOURS.     Neurology:  Migraine Therapy - Triptan Passed - 05/08/2022 10:35 AM      Passed - Last BP in normal range    BP Readings from Last 1 Encounters:  09/25/21 90/62         Passed - Valid encounter within last 12 months    Recent Outpatient Visits           7 months ago Annual physical exam   [facility] Gwyneth Sprout, FNP   7 months ago [place], DO   1 year ago Other migraine without status migrainosus, not intractable   [place] Gwyneth Sprout, FNP   1 year ago    [facility] Tally Joe T, FNP   1 year ago Depression, major, single episode, in partial remission [facility])   [place] Gwyneth Sprout, [place]

## 2022-05-14 ENCOUNTER — Ambulatory Visit: Payer: BC Managed Care – PPO | Admitting: Family Medicine

## 2022-05-15 NOTE — Progress Notes (Unsigned)
I,J'ya E Camryn Quesinberry,acting as a scribe for Gwyneth Sprout, FNP.,have documented all relevant documentation on the behalf of Gwyneth Sprout, FNP,as directed by  Gwyneth Sprout, FNP while in the presence of Gwyneth Sprout, FNP.  Established patient visit  Patient: Charlene Freeman   DOB: [birth year]   52 y.o. Female  MRN: [id number] Visit Date: 05/16/2022  Today's healthcare provider: Gwyneth Sprout, FNP  Re Introduced to nurse practitioner role and practice setting.  All questions answered.  Discussed provider/patient relationship and expectations.  Chief Complaint  Patient presents with   Headache   Subjective    Migraine  This is a chronic problem. The current episode started in the past 7 days. The problem occurs intermittently. The problem has been gradually improving. The pain is located in the Temporal (Primarily left side) region. The pain does not radiate. The pain quality is similar to prior headaches. The quality of the pain is described as band-like, stabbing, sharp and aching. The pain is at a severity of 8/10. The pain is severe. Associated symptoms include nausea and scalp tenderness. Pertinent negatives include no dizziness (when changes positon). Nothing aggravates the symptoms. She has tried NSAIDs, Excedrin and darkened room for the symptoms. The treatment provided mild (After medications wear off, headaches come back more extremem) relief. Her past medical history is significant for migraine headaches.   Patient meets with neurologist regularly wants to discuss the possibility of meeting with a different specialist.   Rash: Patient complains of rash involving the abdomen and back. Rash started 4 days ago. Appearance of rash at onset: Texture of lesion(s): raised. Rash has not changed over time Initial distribution: abdomen and back.  Discomfort associated with rash: causes no discomfort.  Associated symptoms:  skin irritation . Denies: abdominal pain, fever, headache, nausea, and  sore throat. Patient has not had previous evaluation of rash. Patient has not had previous treatment.  Response to treatment: Benadryl cream. Patient has had contacts with similar rash. Patient has not identified precipitant. Patient has not had new exposures (soaps, lotions, laundry detergents, foods, medications, plants, insects or animals.)   Medications: Outpatient Medications Prior to Visit  Medication Sig   ALPRAZolam (XANAX) 1 MG tablet as needed.   eletriptan (RELPAX) 40 MG tablet TAKE ONE TABLET BY MOUTH AT ONSET OF HEADACHE. MAY REPEAT DOSE WITH ONE TABLET IN TWO HOURS IF NEEDED. DO NOT EXCEED TWO TABLETS IN 24 HOURS.   estradiol (ESTRACE) 0.1 MG/GM vaginal cream Vaginal 3 Times a Week   ondansetron (ZOFRAN-ODT) 8 MG disintegrating tablet Take 8 mg by mouth every 8 (eight) hours as needed for nausea or vomiting.   tiZANidine (ZANAFLEX) 4 MG tablet Take 4 mg by mouth 3 (three) times daily.   traZODone (DESYREL) 100 MG tablet Take 200 mg by mouth at bedtime.   [DISCONTINUED] famotidine (PEPCID) 20 MG tablet Take 1 tablet (20 mg total) by mouth daily. (Patient not taking: Reported on 05/16/2022)   [DISCONTINUED] tirzepatide (MOUNJARO) 15 MG/0.5ML Pen Inject 15 mg into the skin once a week. (Patient not taking: Reported on 10/01/2021)   [DISCONTINUED] venlafaxine XR (EFFEXOR XR) 37.5 MG 24 hr capsule Take 3 capsules (112.5 mg total) by mouth daily with breakfast. (Patient not taking: Reported on 05/16/2022)   [DISCONTINUED] venlafaxine XR (EFFEXOR-XR) 75 MG 24 hr capsule Take 225 mg by mouth daily. (Patient not taking: Reported on 05/16/2022)   No facility-administered medications prior to visit.    Review of Systems  Gastrointestinal:  Positive for nausea.  Neurological:  Positive for headaches. Negative for dizziness (when changes positon).     Objective    BP (!) 87/62 (BP Location: Right Arm, Patient Position: Sitting, Cuff Size: Normal)   Pulse 91   Temp 98.2 F (36.8 C) (Oral)    Resp 13   Ht 5\' 5"  (1.651 m)   Wt 127 lb 12.8 oz (58 kg)   SpO2 100%   BMI 21.27 kg/m   Physical Exam Vitals and nursing note reviewed.  Constitutional:      General: She is not in acute distress.    Appearance: Normal appearance. She is well-developed and normal weight. She is not ill-appearing, toxic-appearing or diaphoretic.  HENT:     Head: Normocephalic and atraumatic.  Eyes:     Extraocular Movements: Extraocular movements intact.     Pupils: Pupils are equal, round, and reactive to light.  Cardiovascular:     Rate and Rhythm: Normal rate and regular rhythm.     Pulses: Normal pulses.     Heart sounds: Normal heart sounds. No murmur heard.    No friction rub. No gallop.  Pulmonary:     Effort: Pulmonary effort is normal. No respiratory distress.     Breath sounds: Normal breath sounds. No stridor. No wheezing, rhonchi or rales.  Chest:     Chest wall: No tenderness.  Musculoskeletal:        General: No swelling, tenderness, deformity or signs of injury. Normal range of motion.     Cervical back: Normal range of motion and neck supple.     Right lower leg: No edema.     Left lower leg: No edema.  Skin:    General: Skin is warm and dry.     Capillary Refill: Capillary refill takes less than 2 seconds.     Coloration: Skin is not jaundiced or pale.     Findings: Rash present. No bruising, erythema or lesion.     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 Continue water based moisturizer. Return as needed   Neurological:     General: No focal deficit present.     Mental Status: She is alert and oriented to person, place, and time. Mental status is at baseline.     GCS: GCS eye subscore is 4. GCS verbal subscore is 5. GCS motor subscore is 6.     Cranial Nerves: No cranial nerve deficit.     Sensory: No sensory deficit.     Motor: No weakness.     Coordination: Coordination normal.  Psychiatric:        Mood and Affect: Mood  normal.        Speech: Speech normal.        Behavior: Behavior normal.        Thought Content: Thought content normal.        Judgment: Judgment normal.     No results found for any visits on 05/16/22.  Assessment & Plan     Problem List Items Addressed This Visit       Cardiovascular and Mediastinum   Hypotension due to hypovolemia    Subacute, continue to recommend slow postural changes.  Denies falls or injuries Denies HEENT s/s at this time MAP stable today. Continue to prioritize water and small, frequent, balanced meals. Recommend referral with cardiology if continues to worsen MAP goal >60      Migraine - Primary    Chronic, worsening Coming in  waves of clusters; wishes to complete another round of steroids to assist Will increase emgality to 240 mg Will also start on zonegran; to assist with both migraine and weight mgmt given concern for stress eating and weight gain (slight); however, BMI stable and borderline hypotension.       Relevant Medications   zonisamide (ZONEGRAN) 100 MG capsule   methylPREDNISolone (MEDROL DOSEPAK) 4 MG TBPK tablet   Galcanezumab-gnlm (EMGALITY) 120 MG/ML SOAJ     Other   Screening for colon cancer   Relevant Orders   Cologuard   Return if symptoms worsen or fail to improve- also schedule with Dr Melrose Nakayama.     Vonna Kotyk, FNP, have reviewed all documentation for this visit. The documentation on 05/16/22 for the exam, diagnosis, procedures, and orders are all accurate and complete.  Gwyneth Sprout, [place] [phone] (phone) [phone] (fax)  [place]

## 2022-05-16 ENCOUNTER — Ambulatory Visit (INDEPENDENT_AMBULATORY_CARE_PROVIDER_SITE_OTHER): Payer: BC Managed Care – PPO | Admitting: Family Medicine

## 2022-05-16 ENCOUNTER — Encounter: Payer: Self-pay | Admitting: Family Medicine

## 2022-05-16 VITALS — BP 87/62 | HR 91 | Temp 98.2°F | Resp 13 | Ht 65.0 in | Wt 127.8 lb

## 2022-05-16 DIAGNOSIS — Z1211 Encounter for screening for malignant neoplasm of colon: Secondary | ICD-10-CM | POA: Insufficient documentation

## 2022-05-16 DIAGNOSIS — I9589 Other hypotension: Secondary | ICD-10-CM

## 2022-05-16 DIAGNOSIS — G43E01 Chronic migraine with aura, not intractable, with status migrainosus: Secondary | ICD-10-CM

## 2022-05-16 DIAGNOSIS — E861 Hypovolemia: Secondary | ICD-10-CM

## 2022-05-16 MED ORDER — METHYLPREDNISOLONE 4 MG PO TBPK: ORAL_TABLET | ORAL | 0 refills | Status: DC

## 2022-05-16 MED ORDER — EMGALITY 120 MG/ML ~~LOC~~ SOAJ: 240.0000 mg | SUBCUTANEOUS | 1 refills | Status: DC

## 2022-05-16 MED ORDER — ZONISAMIDE 100 MG PO CAPS: 100.0000 mg | ORAL_CAPSULE | Freq: Every day | ORAL | 11 refills | Status: DC

## 2022-05-16 NOTE — Patient Instructions (Signed)
Medications have been changed; please call and make a follow up with Dr Melrose Nakayama.  Take care, Tally Joe FNP

## 2022-05-16 NOTE — Assessment & Plan Note (Signed)
Subacute, continue to recommend slow postural changes.  Denies falls or injuries Denies HEENT s/s at this time MAP stable today. Continue to prioritize water and small, frequent, balanced meals. Recommend referral with cardiology if continues to worsen MAP goal >60

## 2022-05-16 NOTE — Assessment & Plan Note (Signed)
Chronic, worsening Coming in waves of clusters; wishes to complete another round of steroids to assist Will increase emgality to 240 mg Will also start on zonegran; to assist with both migraine and weight mgmt given concern for stress eating and weight gain (slight); however, BMI stable and borderline hypotension.

## 2022-05-17 ENCOUNTER — Telehealth: Payer: Self-pay | Admitting: Family Medicine

## 2022-05-17 NOTE — Telephone Encounter (Signed)
[facility] requesting prior authorization Keyphase: [id number] Name: Charlene Freeman 155mlg/ml auto injectors

## 2022-05-21 ENCOUNTER — Telehealth: Payer: Self-pay | Admitting: Family Medicine

## 2022-05-21 NOTE — Telephone Encounter (Signed)
[facility] requesting prior authorization Key: [id number] Name: Charlene Freeman 120mg /ml auto injectors

## 2022-05-27 ENCOUNTER — Telehealth: Payer: Self-pay | Admitting: Family Medicine

## 2022-05-27 ENCOUNTER — Encounter: Payer: Self-pay | Admitting: Family Medicine

## 2022-05-27 NOTE — Telephone Encounter (Signed)
Covermymeds requesting prior authorization Key: [id number] Name: Musch Prior authorization for [employer]

## 2022-05-27 NOTE — Telephone Encounter (Signed)
Charlene Freeman (Key: [id number]) PA Case ID #: [id number] Need Help? Call us at [phone] Archived today Outcome : Unknown

## 2022-05-29 NOTE — Telephone Encounter (Signed)
According to cover mymeds this PA was archive [place] 2 days ago and was send by [facility] cma

## 2022-06-14 ENCOUNTER — Telehealth: Payer: Self-pay

## 2022-06-14 NOTE — Telephone Encounter (Signed)
-----   Message from Unknown F[place] sent at 06/03/2022 11:26 AM EDT ----- Please review incoming fax

## 2022-06-20 ENCOUNTER — Other Ambulatory Visit: Payer: Self-pay | Admitting: Family Medicine

## 2022-06-20 DIAGNOSIS — G43809 Other migraine, not intractable, without status migrainosus: Secondary | ICD-10-CM

## 2022-06-21 NOTE — Telephone Encounter (Signed)
Requested Prescriptions  Pending Prescriptions Disp Refills   eletriptan (RELPAX) 40 MG tablet [Pharmacy Med Name: ELETRIPTAN 40 MG TAB] 90 tablet 0    Sig: TAKE 1 TABLET BY MOUTH AT ONSET OF HEADACHE, MAY REPEAT DOSE WITH 1 TABLET IN 2 HOURS IF NEEDED (DO NOT EXCEED 2 TABLETS IN 24 HOURS)     Neurology:  Migraine Therapy - Triptan Failed - 06/20/2022  8:51 PM      Failed - Last BP in normal range    BP Readings from Last 1 Encounters:  05/16/22 (!) 87/62         Passed - Valid encounter within last 12 months    Recent Outpatient Visits           1 month ago Chronic migraine with aura and with status migrainosus, not intractable   [facility] Jacky Kindle, FNP   8 months ago Annual physical exam   [facility] Jacky Kindle, FNP   8 months ago Dysuria   [facility] Caro Laroche, DO   1 year ago Other migraine without status migrainosus, not intractable   [facility] Jacky Kindle, FNP   1 year ago    [facility] Jacky Kindle, [place]

## 2022-07-23 ENCOUNTER — Telehealth: Payer: BC Managed Care – PPO | Admitting: Family Medicine

## 2022-07-23 DIAGNOSIS — R3989 Other symptoms and signs involving the genitourinary system: Secondary | ICD-10-CM | POA: Diagnosis not present

## 2022-07-23 MED ORDER — CEPHALEXIN 500 MG PO CAPS: 500.0000 mg | ORAL_CAPSULE | Freq: Two times a day (BID) | ORAL | 0 refills | Status: AC

## 2022-07-23 NOTE — Progress Notes (Signed)

## 2022-10-01 DIAGNOSIS — Z8679 Personal history of other diseases of the circulatory system: Secondary | ICD-10-CM | POA: Diagnosis not present

## 2022-10-01 DIAGNOSIS — R519 Headache, unspecified: Secondary | ICD-10-CM | POA: Diagnosis not present

## 2022-10-01 DIAGNOSIS — M5481 Occipital neuralgia: Secondary | ICD-10-CM | POA: Diagnosis not present

## 2022-10-02 ENCOUNTER — Other Ambulatory Visit: Payer: Self-pay | Admitting: Student

## 2022-10-02 DIAGNOSIS — M542 Cervicalgia: Secondary | ICD-10-CM

## 2022-10-02 DIAGNOSIS — R519 Headache, unspecified: Secondary | ICD-10-CM

## 2022-10-02 DIAGNOSIS — R200 Anesthesia of skin: Secondary | ICD-10-CM

## 2022-10-02 DIAGNOSIS — H532 Diplopia: Secondary | ICD-10-CM

## 2022-10-19 ENCOUNTER — Ambulatory Visit
Admission: RE | Admit: 2022-10-19 | Discharge: 2022-10-19 | Disposition: A | Discharge status: Home / Self Care | Payer: BC Managed Care – PPO | Source: Ambulatory Visit | Attending: Student | Admitting: Student

## 2022-10-19 ENCOUNTER — Ambulatory Visit: Admission: RE | Admit: 2022-10-19 | Payer: BC Managed Care – PPO | Source: Ambulatory Visit

## 2022-10-19 DIAGNOSIS — M50222 Other cervical disc displacement at C5-C6 level: Secondary | ICD-10-CM | POA: Diagnosis not present

## 2022-10-19 DIAGNOSIS — R200 Anesthesia of skin: Secondary | ICD-10-CM

## 2022-10-19 DIAGNOSIS — H532 Diplopia: Secondary | ICD-10-CM

## 2022-10-19 DIAGNOSIS — R202 Paresthesia of skin: Secondary | ICD-10-CM | POA: Diagnosis not present

## 2022-10-19 DIAGNOSIS — M542 Cervicalgia: Secondary | ICD-10-CM

## 2022-10-19 DIAGNOSIS — M47812 Spondylosis without myelopathy or radiculopathy, cervical region: Secondary | ICD-10-CM | POA: Diagnosis not present

## 2022-10-19 DIAGNOSIS — G43909 Migraine, unspecified, not intractable, without status migrainosus: Secondary | ICD-10-CM | POA: Diagnosis not present

## 2022-10-19 DIAGNOSIS — M4312 Spondylolisthesis, cervical region: Secondary | ICD-10-CM | POA: Diagnosis not present

## 2022-10-19 DIAGNOSIS — R519 Headache, unspecified: Secondary | ICD-10-CM

## 2022-10-31 DIAGNOSIS — M7918 Myalgia, other site: Secondary | ICD-10-CM | POA: Diagnosis not present

## 2022-10-31 DIAGNOSIS — M5481 Occipital neuralgia: Secondary | ICD-10-CM | POA: Diagnosis not present

## 2022-11-05 DIAGNOSIS — F3342 Major depressive disorder, recurrent, in full remission: Secondary | ICD-10-CM | POA: Diagnosis not present

## 2022-11-05 DIAGNOSIS — F411 Generalized anxiety disorder: Secondary | ICD-10-CM | POA: Diagnosis not present

## 2022-11-05 DIAGNOSIS — G4720 Circadian rhythm sleep disorder, unspecified type: Secondary | ICD-10-CM | POA: Diagnosis not present

## 2022-11-26 DIAGNOSIS — M542 Cervicalgia: Secondary | ICD-10-CM | POA: Diagnosis not present

## 2022-11-26 DIAGNOSIS — R519 Headache, unspecified: Secondary | ICD-10-CM | POA: Diagnosis not present

## 2022-11-26 DIAGNOSIS — R200 Anesthesia of skin: Secondary | ICD-10-CM | POA: Diagnosis not present

## 2022-11-26 DIAGNOSIS — M5481 Occipital neuralgia: Secondary | ICD-10-CM | POA: Diagnosis not present

## 2022-12-01 ENCOUNTER — Other Ambulatory Visit: Payer: Self-pay | Admitting: Family Medicine

## 2022-12-01 DIAGNOSIS — G43809 Other migraine, not intractable, without status migrainosus: Secondary | ICD-10-CM

## 2022-12-02 NOTE — Telephone Encounter (Signed)
Requested Prescriptions  Pending Prescriptions Disp Refills   eletriptan (RELPAX) 40 MG tablet [Pharmacy Med Name: ELETRIPTAN 40 MG TAB] 90 tablet 0    Sig: TAKE ONE TABLET BY MOUTH AT ONSET OF HEADACHE. MAY REPEAT DOSE WITH ONE TABLET IN TWO HOURS IF NEEDED. DO NOT EXCEED TWO TABLETS IN 24 HOURS.     Neurology:  Migraine Therapy - Triptan Failed - 12/01/2022 11:18 AM      Failed - Last BP in normal range    BP Readings from Last 1 Encounters:  05/16/22 (!) 87/62         Passed - Valid encounter within last 12 months    Recent Outpatient Visits           6 months ago Chronic migraine with aura and with status migrainosus, not intractable   [facility] Jacky Kindle, FNP   1 year ago Annual physical exam   [facility] Jacky Kindle, FNP   1 year ago Dysuria   [facility] Caro Laroche, DO   1 year ago Other migraine without status migrainosus, not intractable   [facility] Jacky Kindle, FNP   1 year ago    [facility] Jacky Kindle, [place]

## 2022-12-06 ENCOUNTER — Ambulatory Visit: Payer: BC Managed Care – PPO | Admitting: Family Medicine

## 2022-12-06 VITALS — BP 118/85 | HR 100 | Temp 97.5°F | Ht 65.0 in | Wt 129.0 lb

## 2022-12-06 DIAGNOSIS — G44209 Tension-type headache, unspecified, not intractable: Secondary | ICD-10-CM | POA: Diagnosis not present

## 2022-12-06 DIAGNOSIS — R519 Headache, unspecified: Secondary | ICD-10-CM | POA: Insufficient documentation

## 2022-12-06 DIAGNOSIS — Z0289 Encounter for other administrative examinations: Secondary | ICD-10-CM | POA: Insufficient documentation

## 2022-12-06 MED ORDER — PROMETHAZINE HCL 25 MG/ML IJ SOLN
25.0000 mg | Freq: Once | INTRAMUSCULAR | Status: AC
Start: 2022-12-06 — End: 2022-12-06
  Administered 2022-12-06: 25 mg via INTRAMUSCULAR

## 2022-12-06 MED ORDER — KETOROLAC TROMETHAMINE 60 MG/2ML IM SOLN
60.0000 mg | Freq: Once | INTRAMUSCULAR | Status: AC
Start: 2022-12-06 — End: 2022-12-06
  Administered 2022-12-06: 60 mg via INTRAMUSCULAR

## 2022-12-06 NOTE — Assessment & Plan Note (Signed)
Request for medical evaluation form from [place] [employer]; for both pt and spouse to assist with foster parenting

## 2022-12-06 NOTE — Progress Notes (Signed)
Established patient visit   Patient: Charlene Freeman   DOB: [birth year]   52 y.o. Female  MRN: [id number] Visit Date: 12/06/2022  Today's healthcare provider: Jacky Kindle, FNP  Introduced to nurse practitioner role and practice setting.  All questions answered.  Discussed provider/patient relationship and expectations.  Subjective    HPI HPI     Migraine    Additional comments: Patient states that her migraines have increased in frequency and intensity.  She states she had one last night that was worse than she has had in years. Patient also needs to discuss FMLA paperwork      Last edited by Adline Peals, CMA on 12/06/2022  1:33 PM.       Medications: Outpatient Medications Prior to Visit  Medication Sig Note   ALPRAZolam (XANAX) 1 MG tablet as needed.    eletriptan (RELPAX) 40 MG tablet TAKE ONE TABLET BY MOUTH AT ONSET OF HEADACHE. MAY REPEAT DOSE WITH ONE TABLET IN TWO HOURS IF NEEDED. DO NOT EXCEED TWO TABLETS IN 24 HOURS.    estradiol (ESTRACE) 0.1 MG/GM vaginal cream Vaginal 3 Times a Week    Galcanezumab-gnlm (EMGALITY) 120 MG/ML SOAJ Inject 240 mg into the skin once a week. 12/06/2022: Patient states these are monthly   ondansetron (ZOFRAN-ODT) 8 MG disintegrating tablet Take 8 mg by mouth every 8 (eight) hours as needed for nausea or vomiting.    tiZANidine (ZANAFLEX) 4 MG tablet Take 4 mg by mouth 3 (three) times daily.    traZODone (DESYREL) 100 MG tablet Take 200 mg by mouth at bedtime.    methylPREDNISolone (MEDROL DOSEPAK) 4 MG TBPK tablet Take as directed on package, best with food. (Patient not taking: Reported on 12/06/2022)    zonisamide (ZONEGRAN) 100 MG capsule Take 1 capsule (100 mg total) by mouth daily. (Patient not taking: Reported on 12/06/2022)    No facility-administered medications prior to visit.    Review of Systems      Objective    BP 118/85 (BP Location: Left Arm, Patient Position: Sitting, Cuff Size: Normal)   Pulse 100    Temp (!) 97.5 F (36.4 C) (Oral)   Ht 5\' 5"  (1.651 m)   Wt 129 lb (58.5 kg)   SpO2 99%   BMI 21.47 kg/m     Physical Exam Vitals and nursing note reviewed.  Constitutional:      General: She is not in acute distress.    Appearance: Normal appearance. She is normal weight. She is not ill-appearing, toxic-appearing or diaphoretic.  HENT:     Head: Normocephalic and atraumatic.     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     Pulses: Normal pulses.     Heart sounds: Normal heart sounds. No murmur heard.    No friction rub. No gallop.  Pulmonary:     Effort: Pulmonary effort is normal. No respiratory distress.     Breath sounds: Normal breath sounds. No stridor. No wheezing, rhonchi or rales.  Chest:     Chest wall: No tenderness.  Abdominal:     Comments: Recent emesis from migraine   Musculoskeletal:        General: No swelling, tenderness, deformity or signs of injury. Normal range of motion.     Right lower leg: No edema.     Left lower leg: No edema.  Skin:    General: Skin is warm and dry.     Capillary Refill: Capillary refill takes less than 2 seconds.     Coloration: Skin is not jaundiced or pale.     Findings: No bruising, erythema, lesion or rash.  Neurological:     General: No focal deficit present.     Mental Status: She is alert and oriented to person, place, and time. Mental status is at baseline.     Cranial Nerves: No cranial nerve deficit.     Sensory: No sensory deficit.     Motor: No weakness.     Coordination: Coordination normal.  Psychiatric:        Mood and Affect: Mood normal.        Behavior: Behavior normal.        Thought Content: Thought content normal.        Judgment: Judgment normal.     No results found for any visits on 12/06/22.  Assessment & Plan     Problem List Items Addressed  This Visit       Other   Encounter for completion of form with patient    Request for medical evaluation form from [place] DSS; for both pt and spouse to assist with foster parenting       Nonintractable headache - Primary    Acute, reports worsening migraines recently off emgality d/t delay in PA from neuro Migraine cocktail provided in office; daughter, Denita Lung, present to assist with transportation home Continue previous medications as directed by neurology      Addressed extensive list of chronic and acute medical problems today requiring 30 minutes reviewing her medical record, counseling patient regarding her conditions and coordination of care.    Leilani Merl, FNP, have reviewed all documentation for this visit. The documentation on 12/06/22 for the exam, diagnosis, procedures, and orders are all accurate and complete.  Jacky Kindle, FNP  [facility] [phone] (phone) [phone] (fax)  [facility]

## 2022-12-06 NOTE — Assessment & Plan Note (Signed)
Acute, reports worsening migraines recently off emgality d/t delay in PA from neuro Migraine cocktail provided in office; daughter, Denita Lung, present to assist with transportation home Continue previous medications as directed by neurology

## 2023-02-17 ENCOUNTER — Telehealth: Payer: BC Managed Care – PPO | Admitting: Physician Assistant

## 2023-02-17 DIAGNOSIS — R3989 Other symptoms and signs involving the genitourinary system: Secondary | ICD-10-CM | POA: Diagnosis not present

## 2023-02-17 MED ORDER — CEPHALEXIN 500 MG PO CAPS: 500.0000 mg | ORAL_CAPSULE | Freq: Two times a day (BID) | ORAL | 0 refills | Status: AC

## 2023-02-17 NOTE — Progress Notes (Signed)
I have spent 5 minutes in review of e-visit questionnaire, review and updating patient chart, medical decision making and response to patient.   Mia Milan Cody Jacklynn Dehaas, PA-C    

## 2023-02-17 NOTE — Progress Notes (Signed)

## 2023-03-27 DIAGNOSIS — F40298 Other specified phobia: Secondary | ICD-10-CM | POA: Diagnosis not present

## 2023-03-27 DIAGNOSIS — M5481 Occipital neuralgia: Secondary | ICD-10-CM | POA: Diagnosis not present

## 2023-03-27 DIAGNOSIS — R202 Paresthesia of skin: Secondary | ICD-10-CM | POA: Diagnosis not present

## 2023-03-27 DIAGNOSIS — R519 Headache, unspecified: Secondary | ICD-10-CM | POA: Diagnosis not present

## 2023-03-27 DIAGNOSIS — R200 Anesthesia of skin: Secondary | ICD-10-CM | POA: Diagnosis not present

## 2023-03-27 DIAGNOSIS — M542 Cervicalgia: Secondary | ICD-10-CM | POA: Diagnosis not present

## 2023-03-28 ENCOUNTER — Ambulatory Visit: Payer: BC Managed Care – PPO | Admitting: Family Medicine

## 2023-03-31 DIAGNOSIS — E538 Deficiency of other specified B group vitamins: Secondary | ICD-10-CM | POA: Diagnosis not present

## 2023-04-01 DIAGNOSIS — M5412 Radiculopathy, cervical region: Secondary | ICD-10-CM | POA: Diagnosis not present

## 2023-04-01 DIAGNOSIS — M542 Cervicalgia: Secondary | ICD-10-CM | POA: Diagnosis not present

## 2023-04-03 DIAGNOSIS — M5481 Occipital neuralgia: Secondary | ICD-10-CM | POA: Diagnosis not present

## 2023-04-03 DIAGNOSIS — M7918 Myalgia, other site: Secondary | ICD-10-CM | POA: Diagnosis not present

## 2023-04-04 DIAGNOSIS — M7918 Myalgia, other site: Secondary | ICD-10-CM | POA: Diagnosis not present

## 2023-04-04 DIAGNOSIS — M5481 Occipital neuralgia: Secondary | ICD-10-CM | POA: Diagnosis not present

## 2023-04-07 ENCOUNTER — Ambulatory Visit: Payer: BC Managed Care – PPO | Admitting: Family Medicine

## 2023-04-07 DIAGNOSIS — R200 Anesthesia of skin: Secondary | ICD-10-CM | POA: Diagnosis not present

## 2023-04-07 DIAGNOSIS — E538 Deficiency of other specified B group vitamins: Secondary | ICD-10-CM | POA: Diagnosis not present

## 2023-04-14 DIAGNOSIS — E538 Deficiency of other specified B group vitamins: Secondary | ICD-10-CM | POA: Diagnosis not present

## 2023-04-18 DIAGNOSIS — M5412 Radiculopathy, cervical region: Secondary | ICD-10-CM | POA: Diagnosis not present

## 2023-04-28 DIAGNOSIS — F3342 Major depressive disorder, recurrent, in full remission: Secondary | ICD-10-CM | POA: Diagnosis not present

## 2023-04-28 DIAGNOSIS — F411 Generalized anxiety disorder: Secondary | ICD-10-CM | POA: Diagnosis not present

## 2023-04-28 DIAGNOSIS — G4720 Circadian rhythm sleep disorder, unspecified type: Secondary | ICD-10-CM | POA: Diagnosis not present

## 2023-05-04 ENCOUNTER — Telehealth: Payer: Self-pay | Admitting: Family

## 2023-05-04 DIAGNOSIS — J101 Influenza due to other identified influenza virus with other respiratory manifestations: Secondary | ICD-10-CM

## 2023-05-04 MED ORDER — OSELTAMIVIR PHOSPHATE 75 MG PO CAPS: 75.0000 mg | ORAL_CAPSULE | Freq: Two times a day (BID) | ORAL | 0 refills | Status: DC

## 2023-05-04 NOTE — Progress Notes (Signed)
 E visit for Flu like symptoms   We are sorry that you are not feeling well.  Here is how we plan to help! Based on what you have shared with me it looks like you may have a respiratory virus that may be influenza.  Influenza or "the flu" is   an infection caused by a respiratory virus. The flu virus is highly contagious and persons who did not receive their yearly flu vaccination may "catch" the flu from close contact.  We have anti-viral medications to treat the viruses that cause this infection. They are not a "cure" and only shorten the course of the infection. These prescriptions are most effective when they are given within the first 2 days of "flu" symptoms. Antiviral medication are indicated if you have a high risk of complications from the flu. You should  also consider an antiviral medication if you are in close contact with someone who is at risk. These medications can help patients avoid complications from the flu  but have side effects that you should know. Possible side effects from Tamiflu or oseltamivir include nausea, vomiting, diarrhea, dizziness, headaches, eye redness, sleep problems or other respiratory symptoms. You should not take Tamiflu if you have an allergy to oseltamivir or any to the ingredients in Tamiflu.  Based upon your symptoms and potential risk factors I have prescribed Oseltamivir (Tamiflu).  It has been sent to your designated pharmacy.  You will take one 75 mg capsule orally twice a day for the next 5 days.  ANYONE WHO HAS FLU SYMPTOMS SHOULD: Stay home. The flu is highly contagious and going out or to work exposes others! Be sure to drink plenty of fluids. Water is fine as well as fruit juices, sodas and electrolyte beverages. You may want to stay away from caffeine or alcohol. If you are nauseated, try taking small sips of liquids. How do you know if you are getting enough fluid? Your urine should be a pale yellow or almost colorless. Get rest. Taking a steamy  shower or using a humidifier may help nasal congestion and ease sore throat pain. Using a saline nasal spray works much the same way. Cough drops, hard candies and sore throat lozenges may ease your cough. Line up a caregiver. Have someone check on you regularly.   GET HELP RIGHT AWAY IF: You cannot keep down liquids or your medications. You become short of breath Your fell like you are going to pass out or loose consciousness. Your symptoms persist after you have completed your treatment plan MAKE SURE YOU  Understand these instructions. Will watch your condition. Will get help right away if you are not doing well or get worse.  Your e-visit answers were reviewed by a board certified advanced clinical practitioner to complete your personal care plan.  Depending on the condition, your plan could have included both over the counter or prescription medications.  If there is a problem please reply  once you have received a response from your provider.  Your safety is important to [place].  If you have drug allergies check your prescription carefully.    You can use MyChart to ask questions about today's visit, request a non-urgent call back, or ask for a work or school excuse for 24 hours related to this e-Visit. If it has been greater than 24 hours you will need to follow up with your provider, or enter a new e-Visit to address those concerns.  You will get an e-mail in the next  two days asking about your experience.  I hope that your e-visit has been valuable and will speed your recovery. Thank you for using e-visits.  Approximately 5 minutes was spent documenting and reviewing patient's chart.

## 2023-05-30 DIAGNOSIS — R200 Anesthesia of skin: Secondary | ICD-10-CM | POA: Diagnosis not present

## 2023-05-30 DIAGNOSIS — M5481 Occipital neuralgia: Secondary | ICD-10-CM | POA: Diagnosis not present

## 2023-05-30 DIAGNOSIS — Z8679 Personal history of other diseases of the circulatory system: Secondary | ICD-10-CM | POA: Diagnosis not present

## 2023-05-30 DIAGNOSIS — E538 Deficiency of other specified B group vitamins: Secondary | ICD-10-CM | POA: Diagnosis not present

## 2023-06-17 ENCOUNTER — Telehealth: Payer: Self-pay | Admitting: Family Medicine

## 2023-06-17 DIAGNOSIS — G43809 Other migraine, not intractable, without status migrainosus: Secondary | ICD-10-CM

## 2023-06-17 MED ORDER — ELETRIPTAN HYDROBROMIDE 40 MG PO TABS: ORAL_TABLET | ORAL | 0 refills | Status: AC

## 2023-06-17 NOTE — Telephone Encounter (Signed)
 [facility] faxed refill request for the following medications:   eletriptan  (RELPAX ) 40 MG tablet     Please advise.

## 2023-07-04 DIAGNOSIS — E538 Deficiency of other specified B group vitamins: Secondary | ICD-10-CM | POA: Diagnosis not present

## 2023-07-04 DIAGNOSIS — M5481 Occipital neuralgia: Secondary | ICD-10-CM | POA: Diagnosis not present

## 2023-07-04 DIAGNOSIS — Z8679 Personal history of other diseases of the circulatory system: Secondary | ICD-10-CM | POA: Diagnosis not present

## 2023-07-04 DIAGNOSIS — R200 Anesthesia of skin: Secondary | ICD-10-CM | POA: Diagnosis not present

## 2023-08-08 ENCOUNTER — Telehealth: Admitting: Nurse Practitioner

## 2023-08-08 DIAGNOSIS — J069 Acute upper respiratory infection, unspecified: Secondary | ICD-10-CM | POA: Diagnosis not present

## 2023-08-08 MED ORDER — IPRATROPIUM BROMIDE 0.03 % NA SOLN: 2.0000 | Freq: Two times a day (BID) | NASAL | 12 refills | Status: DC

## 2023-08-08 MED ORDER — PROMETHAZINE-DM 6.25-15 MG/5ML PO SYRP: 5.0000 mL | ORAL_SOLUTION | Freq: Four times a day (QID) | ORAL | 0 refills | Status: DC | PRN

## 2023-08-08 NOTE — Progress Notes (Signed)
 E-Visit for Upper Respiratory Infection   We are sorry you are not feeling well.  Here is how we plan to help!  Based on what you have shared with me, it looks like you may have a viral upper respiratory infection.  Upper respiratory infections are caused by a large number of viruses; however, rhinovirus is the most common cause.   Symptoms vary from person to person, with common symptoms including sore throat, cough, fatigue or lack of energy and feeling of general discomfort.  A low-grade fever of up to 100.4 may present, but is often uncommon.  Symptoms vary however, and are closely related to a person's age or underlying illnesses.  The most common symptoms associated with an upper respiratory infection are nasal discharge or congestion, cough, sneezing, headache and pressure in the ears and face.  These symptoms usually persist for about 3 to 10 days, but can last up to 2 weeks.  It is important to know that upper respiratory infections do not cause serious illness or complications in most cases.    Upper respiratory infections can be transmitted from person to person, with the most common method of transmission being a person's hands.  The virus is able to live on the skin and can infect other persons for up to 2 hours after direct contact.  Also, these can be transmitted when someone coughs or sneezes; thus, it is important to cover the mouth to reduce this risk.  To keep the spread of the illness at bay, good hand hygiene is very important.  This is an infection that is most likely caused by a virus. There are no specific treatments other than to help you with the symptoms until the infection runs its course.  We are sorry you are not feeling well.  Here is how we plan to help!   For nasal congestion, you may use an oral decongestants such as Mucinex D or if you have glaucoma or high blood pressure use plain Mucinex.  Saline nasal spray or nasal drops can help and can safely be used as often as  needed for congestion.  For your congestion, I have prescribed Ipratropium Bromide nasal spray 0.03% two sprays in each nostril 2-3 times a day  If you do not have a history of heart disease, hypertension, diabetes or thyroid  disease, prostate/bladder issues or glaucoma, you may also use Sudafed to treat nasal congestion.  It is highly recommended that you consult with a pharmacist or your primary care physician to ensure this medication is safe for you to take.     If you have a cough, you may use cough suppressants such as Delsym and Robitussin.  If you have glaucoma or high blood pressure, you can also use Coricidin HBP.   For cough I have prescribed for you   Meds ordered this encounter  Medications   ipratropium (ATROVENT) 0.03 % nasal spray    Sig: Place 2 sprays into both nostrils every 12 (twelve) hours.    Dispense:  30 mL    Refill:  12   promethazine -dextromethorphan (PROMETHAZINE -DM) 6.25-15 MG/5ML syrup    Sig: Take 5 mLs by mouth 4 (four) times daily as needed for cough.    Dispense:  118 mL    Refill:  0     If you have a sore or scratchy throat, use a saltwater gargle-  to  teaspoon of salt dissolved in a 4-ounce to 8-ounce glass of warm water.  Gargle the solution for approximately  15-30 seconds and then spit.  It is important not to swallow the solution.  You can also use throat lozenges/cough drops and Chloraseptic spray to help with throat pain or discomfort.  Warm or cold liquids can also be helpful in relieving throat pain.  For headache, pain or general discomfort, you can use Ibuprofen  or Tylenol  as directed.   Some authorities believe that zinc sprays or the use of Echinacea may shorten the course of your symptoms.   HOME CARE Only take medications as instructed by your medical team. Be sure to drink plenty of fluids. Water is fine as well as fruit juices, sodas and electrolyte beverages. You may want to stay away from caffeine or alcohol. If you are nauseated,  try taking small sips of liquids. How do you know if you are getting enough fluid? Your urine should be a pale yellow or almost colorless. Get rest. Taking a steamy shower or using a humidifier may help nasal congestion and ease sore throat pain. You can place a towel over your head and breathe in the steam from hot water coming from a faucet. Using a saline nasal spray works much the same way. Cough drops, hard candies and sore throat lozenges may ease your cough. Avoid close contacts especially the very young and the elderly Cover your mouth if you cough or sneeze Always remember to wash your hands.   GET HELP RIGHT AWAY IF: You develop worsening fever. If your symptoms do not improve within 10 days You develop yellow or green discharge from your nose over 3 days. You have coughing fits You develop a severe head ache or visual changes. You develop shortness of breath, difficulty breathing or start having chest pain Your symptoms persist after you have completed your treatment plan  MAKE SURE YOU  Understand these instructions. Will watch your condition. Will get help right away if you are not doing well or get worse.  Thank you for choosing an e-visit.  Your e-visit answers were reviewed by a board certified advanced clinical practitioner to complete your personal care plan. Depending upon the condition, your plan could have included both over the counter or prescription medications.  Please review your pharmacy choice. Make sure the pharmacy is open so you can pick up prescription now. If there is a problem, you may contact your provider through [employer] and have the prescription routed to another pharmacy.  Your safety is important to us . If you have drug allergies check your prescription carefully.   For the next 24 hours you can use MyChart to ask questions about today's visit, request a non-urgent call back, or ask for a work or school excuse. You will get an email in  the next two days asking about your experience. I hope that your e-visit has been valuable and will speed your recovery.  I spent approximately 5 minutes reviewing the patient's history, current symptoms and coordinating their care today.

## 2023-09-18 ENCOUNTER — Encounter

## 2023-10-14 DIAGNOSIS — M542 Cervicalgia: Secondary | ICD-10-CM | POA: Diagnosis not present

## 2023-10-14 DIAGNOSIS — M5412 Radiculopathy, cervical region: Secondary | ICD-10-CM | POA: Diagnosis not present

## 2023-11-05 DIAGNOSIS — M542 Cervicalgia: Secondary | ICD-10-CM | POA: Diagnosis not present

## 2023-11-05 DIAGNOSIS — E538 Deficiency of other specified B group vitamins: Secondary | ICD-10-CM | POA: Diagnosis not present

## 2023-11-05 DIAGNOSIS — R202 Paresthesia of skin: Secondary | ICD-10-CM | POA: Diagnosis not present

## 2023-11-05 DIAGNOSIS — Z1331 Encounter for screening for depression: Secondary | ICD-10-CM | POA: Diagnosis not present

## 2023-11-05 DIAGNOSIS — R200 Anesthesia of skin: Secondary | ICD-10-CM | POA: Diagnosis not present

## 2023-11-06 DIAGNOSIS — M5412 Radiculopathy, cervical region: Secondary | ICD-10-CM | POA: Diagnosis not present

## 2023-11-13 DIAGNOSIS — G4720 Circadian rhythm sleep disorder, unspecified type: Secondary | ICD-10-CM | POA: Diagnosis not present

## 2023-11-13 DIAGNOSIS — F411 Generalized anxiety disorder: Secondary | ICD-10-CM | POA: Diagnosis not present

## 2023-11-13 DIAGNOSIS — F3342 Major depressive disorder, recurrent, in full remission: Secondary | ICD-10-CM | POA: Diagnosis not present

## 2023-11-18 ENCOUNTER — Telehealth: Payer: Self-pay

## 2023-11-18 ENCOUNTER — Other Ambulatory Visit (HOSPITAL_COMMUNITY): Payer: Self-pay

## 2023-11-18 NOTE — Telephone Encounter (Signed)
 Pharmacy Patient Advocate Encounter   Received notification from Onbase that prior authorization for Eletriptan  Hydrobromide 40MG  tablets  is required/requested.   Insurance verification completed.   The patient is insured through [employer] .   Per test claim: PA required; PA submitted to above mentioned insurance via Latent Key/confirmation #/EOC [id number] Status is pending

## 2023-11-19 ENCOUNTER — Other Ambulatory Visit (HOSPITAL_COMMUNITY): Payer: Self-pay

## 2023-11-19 NOTE — Telephone Encounter (Signed)
 SABRA

## 2023-12-01 ENCOUNTER — Ambulatory Visit (INDEPENDENT_AMBULATORY_CARE_PROVIDER_SITE_OTHER)

## 2023-12-01 VITALS — BP 103/72 | HR 93 | Ht 65.0 in | Wt 126.9 lb

## 2023-12-01 DIAGNOSIS — F410 Panic disorder [episodic paroxysmal anxiety] without agoraphobia: Secondary | ICD-10-CM | POA: Diagnosis not present

## 2023-12-01 DIAGNOSIS — R635 Abnormal weight gain: Secondary | ICD-10-CM | POA: Diagnosis not present

## 2023-12-01 DIAGNOSIS — G43E01 Chronic migraine with aura, not intractable, with status migrainosus: Secondary | ICD-10-CM | POA: Diagnosis not present

## 2023-12-01 DIAGNOSIS — F330 Major depressive disorder, recurrent, mild: Secondary | ICD-10-CM

## 2023-12-01 MED ORDER — ZEPBOUND 10 MG/0.5ML ~~LOC~~ SOLN: 10.0000 mg | SUBCUTANEOUS | 3 refills | Status: DC

## 2023-12-01 MED ORDER — ZEPBOUND 10 MG/0.5ML ~~LOC~~ SOLN: 10.0000 mg | SUBCUTANEOUS | 0 refills | Status: DC

## 2023-12-01 MED ORDER — EMGALITY 120 MG/ML ~~LOC~~ SOAJ: 120.0000 mg | SUBCUTANEOUS | 1 refills | Status: DC

## 2023-12-01 MED ORDER — EMGALITY 120 MG/ML ~~LOC~~ SOAJ: 120.0000 mg | SUBCUTANEOUS | 1 refills | Status: AC

## 2023-12-01 NOTE — Progress Notes (Signed)
 New patient visit  Patient: Charlene Freeman   DOB: 07-05-1970   53 y.o. Female  MRN: [id number] Visit Date: 12/01/2023  Today's healthcare provider: Isaiah DELENA Pepper, MD   Chief Complaint  Patient presents with   Transfer of Care    Patient last seen by Kelly Cedar. Last visit was  12/06/22. Patient would like to establish care with new provider in office and discuss weight loss medication and to see if provider can manage her Emgality     Weight Loss    Would like to see about going back on injection for maintaining weight loss   Care Management    Influenza declined Zoster declined Pneumococcal declined Tetanus declined Hepatitis B declined Mammogram scheduled in 2 weeks with Gyn and they handle   Subjective    Charlene Freeman is a 53 y.o. female who presents today as a new patient to establish care.   - Needs Emgality  refilled, having trouble getting refill from neurology  Weight: - Was previously on Mounjaro  and lost a good amount of weight - Has been taking 15mg  weekly but ran out recently - Has gained some weight back - Interested in staying on the medication to help maintain weight loss  Mood - has been on cymbalta, trazodone,  Xanax PRN - managed by psychiatry  Migraines: - Takes Emgality  monthly - Takes eletriptan  PRN - Follows with neurology  Past Medical History:  Diagnosis Date   Anxiety    Depression    Headache(784.0)    Migraine    Migraine    Past Surgical History:  Procedure Laterality Date   APPENDECTOMY     Family Status  Relation Name Status   Other  (Not Specified)   Mother  Alive   Father  Alive   Sister  Alive  No partnership data on file   Family History  Problem Relation Age of Onset   Migraines Other    Glaucoma Other    Social History   Socioeconomic History   Marital status: Married    Spouse name: Not on file   Number of children: Not on file   Years of education: Not on file   Highest education level:  Not on file  Occupational History   Not on file  Tobacco Use   Smoking status: Never   Smokeless tobacco: Never  Substance and Sexual Activity   Alcohol use: No   Drug use: No   Sexual activity: Not on file  Other Topics Concern   Not on file  Social History Narrative   ** Merged History Encounter **       Social Drivers of Health   Financial Resource Strain: Low Risk  (04/01/2023)   Received from [facility]   Overall Financial Resource Strain (CARDIA)    Difficulty of Paying Living Expenses: Not hard at all  Food Insecurity: No Food Insecurity (04/01/2023)   Received from [facility]   Hunger Vital Sign    Within the past 12 months, you worried that your food would run out before you got the money to buy more.: Never true    Within the past 12 months, the food you bought just didn't last and you didn't have money to get more.: Never true  Transportation Needs: No Transportation Needs (04/01/2023)   Received from [facility] - Transportation    In the past 12 months, has lack of transportation kept you from medical appointments  or from getting medications?: No    Lack of Transportation (Non-Medical): No  Physical Activity: Not on file  Stress: Not on file  Social Connections: Not on file   Outpatient Medications Prior to Visit  Medication Sig   ALPRAZolam (XANAX) 1 MG tablet as needed.   DULoxetine (CYMBALTA) 30 MG capsule Take 90 mg by mouth daily.   eletriptan  (RELPAX ) 40 MG tablet TAKE ONE TABLET BY MOUTH AT ONSET OF HEADACHE. MAY REPEAT DOSE WITH ONE TABLET IN TWO HOURS IF NEEDED. DO NOT EXCEED TWO TABLETS IN 24 HOURS.   midodrine (PROAMATINE) 2.5 MG tablet Take 2.5 mg by mouth 3 (three) times daily.   ondansetron  (ZOFRAN -ODT) 8 MG disintegrating tablet Take 8 mg by mouth every 8 (eight) hours as needed for nausea or vomiting.   pregabalin (LYRICA) 200 MG capsule Take 200 mg by mouth.   tiZANidine (ZANAFLEX) 4  MG tablet Take 4 mg by mouth 3 (three) times daily.   traZODone (DESYREL) 100 MG tablet Take 200 mg by mouth at bedtime.   [DISCONTINUED] Galcanezumab -gnlm (EMGALITY ) 120 MG/ML SOAJ Inject 240 mg into the skin once a week.   [DISCONTINUED] estradiol (ESTRACE) 0.1 MG/GM vaginal cream Vaginal 3 Times a Week (Patient not taking: Reported on 12/01/2023)   [DISCONTINUED] ipratropium (ATROVENT ) 0.03 % nasal spray Place 2 sprays into both nostrils every 12 (twelve) hours. (Patient not taking: Reported on 12/01/2023)   [DISCONTINUED] methylPREDNISolone  (MEDROL  DOSEPAK) 4 MG TBPK tablet Take as directed on package, best with food. (Patient not taking: Reported on 12/01/2023)   [DISCONTINUED] oseltamivir  (TAMIFLU ) 75 MG capsule Take 1 capsule (75 mg total) by mouth 2 (two) times daily. (Patient not taking: Reported on 12/01/2023)   [DISCONTINUED] promethazine -dextromethorphan (PROMETHAZINE -DM) 6.25-15 MG/5ML syrup Take 5 mLs by mouth 4 (four) times daily as needed for cough. (Patient not taking: Reported on 12/01/2023)   [DISCONTINUED] zonisamide  (ZONEGRAN ) 100 MG capsule Take 1 capsule (100 mg total) by mouth daily. (Patient not taking: Reported on 12/01/2023)   No facility-administered medications prior to visit.   No Known Allergies  Reviews of Systems as noted in HPI.      Objective    BP 103/72 (BP Location: Left Arm, Patient Position: Sitting, Cuff Size: Normal)   Pulse 93   Ht 5' 5 (1.651 m)   Wt 126 lb 14.4 oz (57.6 kg)   SpO2 100%   BMI 21.12 kg/m     Physical Exam Constitutional:      Appearance: Normal appearance.  HENT:     Head: Normocephalic and atraumatic.     Mouth/Throat:     Mouth: Mucous membranes are moist.  Eyes:     Pupils: Pupils are equal, round, and reactive to light.  Pulmonary:     Effort: Pulmonary effort is normal.  Skin:    General: Skin is warm.  Neurological:     General: No focal deficit present.     Mental Status: She is alert.     Depression  Screen    12/06/2022    1:31 PM 05/16/2022   10:23 AM 10/01/2021    3:07 PM 09/25/2021   11:56 AM  PHQ 2/9 Scores  PHQ - 2 Score 0 0 0 0  PHQ- 9 Score 0 2 1 2    No results found for any visits on 12/01/23.  Assessment & Plan      Problem List Items Addressed This Visit       Cardiovascular and Mediastinum   Migraine   Chronic, uncontrolled  since running out of Emgality . Follows with neurology. Will refill Emgality , will need PA. Continue eletriptan  PRN. Recommend patient  follow up with neurology.      Relevant Medications   DULoxetine (CYMBALTA) 30 MG capsule   midodrine (PROAMATINE) 2.5 MG tablet   pregabalin (LYRICA) 200 MG capsule   Galcanezumab -gnlm (EMGALITY ) 120 MG/ML SOAJ     Other   Mild episode of recurrent major depressive disorder   Chronic, stable. Continue Cymbalta and trazodone. Follow up with psychiatry.      Relevant Medications   DULoxetine (CYMBALTA) 30 MG capsule   Panic attacks   Chronic, controlled. Continue Xanax PRN, managed by psychiatry.       Relevant Medications   DULoxetine (CYMBALTA) 30 MG capsule   Weight gain - Primary   Previous BMI of 31 in 2022. Patient took Mounjaro  for weight loss, now at goal weight. Since stopping Mounjaro , she has gained weight back. She is interested in continuing her medication to maintain her weight in the future. - Discussed that insurance would not cover it based on current BMI, patient is interested in self-pay options. Will send Zepbound  10mg  to [facility]. Information provided to patient - Encouraged healthy diet and exercise habits - Follow up in 3 months      Relevant Medications   tirzepatide  (ZEPBOUND ) 10 MG/0.5ML injection vial     Return in about 3 months (around 03/02/2024) for Follow Up Weight.      Isaiah DELENA Pepper, MD  [facility] [phone] (phone) [phone] (fax)

## 2023-12-01 NOTE — Assessment & Plan Note (Signed)
 Previous BMI of 31 in 2022. Patient took Mounjaro  for weight loss, now at goal weight. Since stopping Mounjaro , she has gained weight back. She is interested in continuing her medication to maintain her weight in the future. - Discussed that insurance would not cover it based on current BMI, patient is interested in self-pay options. Will send Zepbound  10mg  to [facility]. Information provided to patient - Encouraged healthy diet and exercise habits - Follow up in 3 months

## 2023-12-01 NOTE — Assessment & Plan Note (Signed)
 Chronic, controlled. Continue Xanax PRN, managed by psychiatry.

## 2023-12-01 NOTE — Assessment & Plan Note (Signed)
 Chronic, uncontrolled since running out of Emgality . Follows with neurology. Will refill Emgality , will need PA. Continue eletriptan  PRN. Recommend patient  follow up with neurology.

## 2023-12-01 NOTE — Assessment & Plan Note (Addendum)
 Chronic, stable. Continue Cymbalta and trazodone. Follow up with psychiatry.

## 2023-12-02 DIAGNOSIS — M542 Cervicalgia: Secondary | ICD-10-CM | POA: Diagnosis not present

## 2023-12-02 DIAGNOSIS — M5412 Radiculopathy, cervical region: Secondary | ICD-10-CM | POA: Diagnosis not present

## 2023-12-03 ENCOUNTER — Other Ambulatory Visit (HOSPITAL_COMMUNITY): Payer: Self-pay

## 2023-12-03 ENCOUNTER — Telehealth: Payer: Self-pay

## 2023-12-03 NOTE — Telephone Encounter (Signed)
PA request has been Submitted.

## 2023-12-03 NOTE — Telephone Encounter (Signed)
 Copied from CRM #[id number]. Topic: Clinical - Medication Prior Auth >> Dec 03, 2023 11:27 AM Antwanette L wrote: Reason for CRM: Geni, a Pharmacologist at [facility] [employer], is requesting a prior authorization for Galcanezumab -gnlm (Emgality ) 120 mg/mL SOAJ. Please submit the authorization to [employer] via CoverMyMeds.For any questions or follow-up, Geni can be reached at [phone].

## 2023-12-03 NOTE — Telephone Encounter (Signed)
 Noted

## 2023-12-04 ENCOUNTER — Other Ambulatory Visit (HOSPITAL_COMMUNITY): Payer: Self-pay

## 2023-12-04 ENCOUNTER — Telehealth: Payer: Self-pay

## 2023-12-04 NOTE — Telephone Encounter (Signed)
 Pharmacy Patient Advocate Encounter  Insurance verification completed.   The patient is insured through [facility]   Ran test claim for Emgality  120mg /ml. Currently a quantity of 1ml is a 30 day supply and the co-pay is 35 . The current 30 day co-pay is, $35.  No PA needed at this time.  This test claim was processed through [facility]- copay amounts may vary at other pharmacies due to pharmacy/plan contracts, or as the patient moves through the different stages of their insurance plan.

## 2023-12-17 DIAGNOSIS — R519 Headache, unspecified: Secondary | ICD-10-CM | POA: Diagnosis not present

## 2023-12-17 DIAGNOSIS — M5481 Occipital neuralgia: Secondary | ICD-10-CM | POA: Diagnosis not present

## 2023-12-17 DIAGNOSIS — M542 Cervicalgia: Secondary | ICD-10-CM | POA: Diagnosis not present

## 2024-01-14 ENCOUNTER — Telehealth: Admitting: Physician Assistant

## 2024-01-14 DIAGNOSIS — R3989 Other symptoms and signs involving the genitourinary system: Secondary | ICD-10-CM | POA: Diagnosis not present

## 2024-01-14 MED ORDER — CEPHALEXIN 500 MG PO CAPS: 500.0000 mg | ORAL_CAPSULE | Freq: Two times a day (BID) | ORAL | 0 refills | Status: AC

## 2024-01-14 NOTE — Progress Notes (Signed)

## 2024-02-06 ENCOUNTER — Ambulatory Visit: Admitting: Family Medicine

## 2024-02-06 ENCOUNTER — Encounter: Payer: Self-pay | Admitting: Family Medicine

## 2024-02-06 VITALS — BP 122/69 | HR 90 | Resp 16 | Wt 133.0 lb

## 2024-02-06 DIAGNOSIS — J069 Acute upper respiratory infection, unspecified: Secondary | ICD-10-CM

## 2024-02-06 DIAGNOSIS — L309 Dermatitis, unspecified: Secondary | ICD-10-CM

## 2024-02-06 MED ORDER — TRIAMCINOLONE ACETONIDE 0.5 % EX CREA: 1.0000 | TOPICAL_CREAM | Freq: Two times a day (BID) | CUTANEOUS | 0 refills | Status: AC

## 2024-02-06 NOTE — Progress Notes (Signed)
 "     Established patient visit   Patient: Charlene Freeman   DOB: 10/20/1970   53 y.o. Female  MRN: [id number] Visit Date: 02/06/2024  Today's healthcare provider: Nancyann Perry, MD   Chief Complaint  Patient presents with   Rash    Patient reports getting bumps on her arm-taken benadryl  with no change.  Patient has been putting gold bond eczema cream and is not helping.   URI    Runny nose started today, but she started to feel tired yesterday. Not able to smell or taste. Patient did a Covid test this AM and it was negative.   Subjective    Discussed the use of AI scribe software for clinical note transcription with the patient, who gave verbal consent to proceed.  History of Present Illness   Charlene Freeman is a 53 year old female who presents with a pruritic rash and nasal congestion.  She developed a pruritic rash that initially appeared on her face and has since spread to her back. The rash is itchy and rough, and she notices areas where she has been scratching in her sleep. She has tried using Gold Bond cream, which initially cleared the rash, but it has since returned. She has also been using Benadryl  and Benadryl  ointment without relief. She recently purchased new sheets, which she did not wash before use, and suspects they may have triggered the rash. She has since switched back to her old sheets. No rash is present on her hands, feet, or legs. No new medications, supplements, or changes in personal care products. No fevers, chills, or sweats.  She experiences nasal congestion and is unable to open her nose despite blowing it. She had a sore throat yesterday, which resolved with Motrin . She has been caring for her children who recently had RSV, and she has been feeling tired but not achy. She reports a negative COVID test taken this morning, but she has lost her sense of taste and smell since yesterday. No fever, chills, or sweats. No changes in laundry detergents, soaps,  or hair products. No signs of bed bugs or mites in her home.     Medications: Show/hide medication list[1] Review of Systems  Constitutional:  Negative for appetite change, chills, fatigue and fever.  Respiratory:  Negative for chest tightness and shortness of breath.   Cardiovascular:  Negative for chest pain and palpitations.  Gastrointestinal:  Negative for abdominal pain, nausea and vomiting.  Neurological:  Negative for dizziness and weakness.       Objective    BP 122/69 (BP Location: Right Arm, Patient Position: Sitting, Cuff Size: Normal)   Pulse 90   Resp 16   Wt 133 lb (60.3 kg)   SpO2 99%   BMI 22.13 kg/m   Physical Exam   General appearance: Well developed, well nourished female, cooperative and in no acute distress Head: Normocephalic, without obvious abnormality, atraumatic Respiratory: Respirations even and unlabored, normal respiratory rate Extremities: All extremities are intact.  Skin: Skin color, texture, turgor normal. Diffuse papular rash both forearms and dorsum of elbows, another patch across mid patch. No excoriations, no burroughs. No lesions of hands.  Psych: Appropriate mood and affect. Neurologic: Mental status: Alert, oriented to person, place, and time, thought content appropriate.    Assessment & Plan        Allergic contact dermatitis Likely due to new sheets, presenting with pruritic rash. Differential includes mite infestation, but presentation is atypical. Over-the-counter treatments ineffective. - Prescribed  triamcinolone  cream twice daily, reducing to once daily as symptoms improve. - Advised against using soap on affected areas. - Recommended moisturizing cream if needed, avoid over triamcinolone . - If no improvement in one week, consider treatment for mite infestation with Alomide or Permethrin.  Acute upper respiratory infection Symptoms include nasal congestion, anosmia, and sore throat. COVID test negative. - Recommended Sudafed  for nasal congestion. - Advised against prolonged use of decongestant nasal sprays.    No follow-ups on file.     Nancyann Perry, MD  [facility] [phone] (phone) [phone] (fax)  [facility]    [1]  Outpatient Medications Prior to Visit  Medication Sig   ALPRAZolam (XANAX) 1 MG tablet as needed.   DULoxetine (CYMBALTA) 30 MG capsule Take 90 mg by mouth daily.   eletriptan  (RELPAX ) 40 MG tablet TAKE ONE TABLET BY MOUTH AT ONSET OF HEADACHE. MAY REPEAT DOSE WITH ONE TABLET IN TWO HOURS IF NEEDED. DO NOT EXCEED TWO TABLETS IN 24 HOURS.   Galcanezumab -gnlm (EMGALITY ) 120 MG/ML SOAJ Inject 120 mg into the skin every 30 (thirty) days.   midodrine (PROAMATINE) 2.5 MG tablet Take 2.5 mg by mouth 3 (three) times daily.   ondansetron  (ZOFRAN -ODT) 8 MG disintegrating tablet Take 8 mg by mouth every 8 (eight) hours as needed for nausea or vomiting.   pregabalin (LYRICA) 200 MG capsule Take 200 mg by mouth.   tirzepatide  (ZEPBOUND ) 10 MG/0.5ML injection vial Inject 10 mg into the skin once a week.   tiZANidine (ZANAFLEX) 4 MG tablet Take 4 mg by mouth 3 (three) times daily.   traZODone (DESYREL) 100 MG tablet Take 200 mg by mouth at bedtime.   No facility-administered medications prior to visit.   "

## 2024-02-11 DIAGNOSIS — Z1231 Encounter for screening mammogram for malignant neoplasm of breast: Secondary | ICD-10-CM

## 2024-02-12 ENCOUNTER — Other Ambulatory Visit: Payer: Self-pay

## 2024-02-12 DIAGNOSIS — R635 Abnormal weight gain: Secondary | ICD-10-CM

## 2024-03-12 ENCOUNTER — Telehealth: Admitting: Physician Assistant

## 2024-03-12 DIAGNOSIS — J069 Acute upper respiratory infection, unspecified: Secondary | ICD-10-CM

## 2024-03-12 MED ORDER — PROMETHAZINE-DM 6.25-15 MG/5ML PO SYRP: 5.0000 mL | ORAL_SOLUTION | Freq: Four times a day (QID) | ORAL | 0 refills | Status: AC | PRN

## 2024-03-12 MED ORDER — IPRATROPIUM BROMIDE 0.03 % NA SOLN: 2.0000 | Freq: Two times a day (BID) | NASAL | 0 refills | Status: AC

## 2024-03-12 NOTE — Progress Notes (Signed)
